# Patient Record
Sex: Female | Born: 1950 | Race: Black or African American | Hispanic: No | Marital: Single | Smoking: Current some day smoker
Health system: Southern US, Community
[De-identification: ages and names within clinical notes are randomized; demographics above are authoritative.]

## PROBLEM LIST (undated history)

## (undated) DIAGNOSIS — K219 Gastro-esophageal reflux disease without esophagitis: Secondary | ICD-10-CM

## (undated) DIAGNOSIS — A159 Respiratory tuberculosis unspecified: Secondary | ICD-10-CM

## (undated) DIAGNOSIS — M1990 Unspecified osteoarthritis, unspecified site: Secondary | ICD-10-CM

## (undated) DIAGNOSIS — R0602 Shortness of breath: Secondary | ICD-10-CM

## (undated) DIAGNOSIS — N183 Chronic kidney disease, stage 3 unspecified: Secondary | ICD-10-CM

## (undated) DIAGNOSIS — R0601 Orthopnea: Secondary | ICD-10-CM

## (undated) DIAGNOSIS — J449 Chronic obstructive pulmonary disease, unspecified: Secondary | ICD-10-CM

## (undated) DIAGNOSIS — I10 Essential (primary) hypertension: Secondary | ICD-10-CM

## (undated) DIAGNOSIS — E7800 Pure hypercholesterolemia, unspecified: Secondary | ICD-10-CM

## (undated) DIAGNOSIS — J45909 Unspecified asthma, uncomplicated: Secondary | ICD-10-CM

## (undated) HISTORY — PX: COLONOSCOPY: SHX174

## (undated) HISTORY — PX: TONSILLECTOMY: SUR1361

## (undated) HISTORY — PX: TUBAL LIGATION: SHX77

---

## 1994-08-09 DIAGNOSIS — A159 Respiratory tuberculosis unspecified: Secondary | ICD-10-CM

## 1994-08-09 HISTORY — DX: Respiratory tuberculosis unspecified: A15.9

## 2013-05-30 ENCOUNTER — Other Ambulatory Visit: Payer: Self-pay | Admitting: Orthopedic Surgery

## 2013-06-06 ENCOUNTER — Encounter (HOSPITAL_COMMUNITY): Payer: Self-pay | Admitting: Pharmacy Technician

## 2013-06-06 ENCOUNTER — Encounter (HOSPITAL_COMMUNITY)
Admission: RE | Admit: 2013-06-06 | Discharge: 2013-06-06 | Disposition: A | Discharge status: Home / Self Care | Payer: Medicare Other | Source: Ambulatory Visit | Attending: Orthopedic Surgery | Admitting: Orthopedic Surgery

## 2013-06-06 ENCOUNTER — Encounter (HOSPITAL_COMMUNITY): Payer: Self-pay

## 2013-06-06 DIAGNOSIS — Z01812 Encounter for preprocedural laboratory examination: Secondary | ICD-10-CM | POA: Insufficient documentation

## 2013-06-06 DIAGNOSIS — Z01810 Encounter for preprocedural cardiovascular examination: Secondary | ICD-10-CM | POA: Insufficient documentation

## 2013-06-06 DIAGNOSIS — Z01818 Encounter for other preprocedural examination: Secondary | ICD-10-CM | POA: Insufficient documentation

## 2013-06-06 HISTORY — DX: Chronic obstructive pulmonary disease, unspecified: J44.9

## 2013-06-06 HISTORY — DX: Essential (primary) hypertension: I10

## 2013-06-06 HISTORY — DX: Gastro-esophageal reflux disease without esophagitis: K21.9

## 2013-06-06 HISTORY — DX: Respiratory tuberculosis unspecified: A15.9

## 2013-06-06 HISTORY — DX: Unspecified asthma, uncomplicated: J45.909

## 2013-06-06 LAB — APTT: aPTT: 29 seconds (ref 24–37)

## 2013-06-06 LAB — URINALYSIS, ROUTINE W REFLEX MICROSCOPIC
Bilirubin Urine: NEGATIVE
Ketones, ur: NEGATIVE mg/dL
Nitrite: POSITIVE — AB
Urobilinogen, UA: 1 mg/dL (ref 0.0–1.0)

## 2013-06-06 LAB — CBC WITH DIFFERENTIAL/PLATELET
Eosinophils Relative: 4 % (ref 0–5)
Hemoglobin: 13.9 g/dL (ref 12.0–15.0)
Lymphocytes Relative: 43 % (ref 12–46)
Lymphs Abs: 4.1 10*3/uL — ABNORMAL HIGH (ref 0.7–4.0)
MCH: 30.6 pg (ref 26.0–34.0)
MCV: 89 fL (ref 78.0–100.0)
Monocytes Relative: 7 % (ref 3–12)
Neutrophils Relative %: 46 % (ref 43–77)
Platelets: 217 10*3/uL (ref 150–400)
RBC: 4.54 MIL/uL (ref 3.87–5.11)
WBC: 9.5 10*3/uL (ref 4.0–10.5)

## 2013-06-06 LAB — BASIC METABOLIC PANEL
BUN: 26 mg/dL — ABNORMAL HIGH (ref 6–23)
CO2: 25 mEq/L (ref 19–32)
Calcium: 9.5 mg/dL (ref 8.4–10.5)
Chloride: 100 mEq/L (ref 96–112)
GFR calc non Af Amer: 41 mL/min — ABNORMAL LOW (ref >90)
Glucose, Bld: 92 mg/dL (ref 70–99)
Potassium: 4.5 mEq/L (ref 3.5–5.1)
Sodium: 138 mEq/L (ref 135–145)

## 2013-06-06 LAB — PROTIME-INR
INR: 0.96 (ref 0.00–1.49)
Prothrombin Time: 12.6 seconds (ref 11.6–15.2)

## 2013-06-06 LAB — SURGICAL PCR SCREEN: MRSA, PCR: NEGATIVE

## 2013-06-06 LAB — TYPE AND SCREEN
ABO/RH(D): A POS
Antibody Screen: NEGATIVE

## 2013-06-06 NOTE — Pre-Procedure Instructions (Signed)
LUCINDY BOREL  06/06/2013   Your procedure is scheduled on:  June 13, 2013  Report to [facility] Short Stay (use Main Entrance "A'') at 10:45 AM.  Call this number if you have problems the morning of surgery: [phone]   Remember:   Do not eat food or drink liquids after midnight.   Take these medicines the morning of surgery with A SIP OF WATER: Atenolol 25 mg, Amlodipine 10 mg,Omeprazole 40 mg, Symbicort 1 puff if needed: Hydrocodone for pain Stop taking Aspirin, vitamins, and herbal medications. Do not take any NSAIDs ie: Ibuprofen, Advil, Naproxen or any medication containing Aspirin.  Do not wear jewelry, make-up or nail polish.  Do not wear lotions, powders, or perfumes. You may wear deodorant.  Do not shave 48 hours prior to surgery..  Do not bring valuables to the hospital.  [facility] is not responsible for any belongings or valuables.               Contacts, dentures or bridgework may not be worn into surgery.  Leave suitcase in the car. After surgery it may be brought to your room.  For patients admitted to the hospital, discharge time is determined by your treatment team.               Patients discharged the day of surgery will not be allowed to drive home.  Name and phone number of your driver:   Special Instructions: Shower using CHG 2 nights before surgery and the night before surgery.  If you shower the day of surgery use CHG.  Use special wash - you have one bottle of CHG for all showers.  You should use approximately 1/3 of the bottle for each shower.   Please read over the following fact sheets that you were given: Pain Booklet, Coughing and Deep Breathing, Blood Transfusion Information, MRSA Information and Surgical Site Infection Prevention

## 2013-06-06 NOTE — Progress Notes (Signed)
Pt stated that she gets SOB with exertion but denies chest pain and being under the care of a cardiologist. Pt denies having an EKG and chest x ray within the lat year. Pt stated that she had an stress test 7 years ago but unsure of the location and an echo around 2 years ago at [facility] records were requested from Dr. Georgena Spurling.

## 2013-06-07 ENCOUNTER — Encounter (HOSPITAL_COMMUNITY): Payer: Self-pay

## 2013-06-07 NOTE — Progress Notes (Signed)
Holly in medical records at [facility] is checking to see if pt. Has echo/stress/ekg/notes from a Dr. Irma Newness [phone]. She states his office is closed but she will go over there later today.

## 2013-06-07 NOTE — Progress Notes (Addendum)
Anesthesia Chart Review:  Patient is a 62 year old female scheduled for left TKA on 06/13/13 by Dr. Turner Daniels.  History includes smoking, HTN, CKD, asthma, COPD, chronic DOE, GERD, TB '96, tonsillectomy, tubal ligation. She reported marijuana use. PCP is Dr. Patria Mane with [facility]) who is aware of plans for surgery (see 05/04/13 note).  His note mentions that she will ultimately require a right TKA as well.  EKG on 06/06/13 showed SB @ 53 bpm. She reported a stress test > 5 years ago.  She thought that she had an echo within the past three years.  Records from Dr. Retta Mac at [facility] [phone]) have been requested, but are still pending.  CXR on 06/06/13 showed no acute finding.  Preoperative labs noted. UA was positive for nitrites, trace leukocytes, and many bacteria.  No culture was done, and now too much time has passed to add a C&S.  I have called UA results to Agustin Cree at Dr. Wadie Lessen office.  She will have him review for further recommendations.  I'll follow-up once additional cardiology records if received.  Velna Ochs [facility] Short Stay Center/Anesthesiology Phone [phone] 06/07/2013 4:21 PM  Addendum: 06/08/2013 5:00 PM I received last 2012 records from Dr. Luberta Robertson.  She was seen for chest and abdominal pain with nocturnal reflux symptoms.  From notes, it appears GERD was the likely culprit as a PPI was prescribed, and she was referred to Dr. Norma Fredrickson with GI.  Any cardiac studies on file requested as available--none received.  [facility] also does not have any records of echo being done there.  I did leave voice mail with patient earlier today asking her to call me so I could inquire where to locate a copy of her reported prior echo--if it has been within the past three years.  She reported her stress test was > 5 years ago.    Her preoperative EKG was unremarkable and her PCP is aware of plans for surgery. He last saw her  on 05/04/13 and no additional testing was ordered preoperatively.  I will follow-up any additional records if I hear back from patient, otherwise she will be evaluated by her assigned anesthesiologist on the day of surgery.  If no acute cardiopulmonary symptoms then I would anticipate that she could proceed as planned.

## 2013-06-12 DIAGNOSIS — M1712 Unilateral primary osteoarthritis, left knee: Secondary | ICD-10-CM | POA: Diagnosis present

## 2013-06-12 MED ORDER — CEFAZOLIN SODIUM-DEXTROSE 2-3 GM-% IV SOLR
2.0000 g | INTRAVENOUS | Status: AC
  Administered 2013-06-13: 2 g via INTRAVENOUS
  Filled 2013-06-12: qty 50

## 2013-06-12 NOTE — H&P (Signed)
TOTAL KNEE ADMISSION H&P  Patient is being admitted for left total knee arthroplasty.  Subjective:  Chief Complaint:left knee pain.  HPI: Terri York, 62 y.o. female, has a history of pain and functional disability in the left knee due to arthritis and has failed non-surgical conservative treatments for greater than 12 weeks to includeNSAID's and/or analgesics, corticosteriod injections and activity modification.  Onset of symptoms was gradual, starting 6 years ago with gradually worsening course since that time. The patient noted no past surgery on the left knee(s).  Patient currently rates pain in the left knee(s) at 10 out of 10 with activity. Patient has night pain, worsening of pain with activity and weight bearing, pain that interferes with activities of daily living and pain with passive range of motion.  Patient has evidence of joint space narrowing by imaging studies.  There is no active infection.  There are no active problems to display for this patient.  Past Medical History  Diagnosis Date  . Hypertension   . Asthma   . COPD (chronic obstructive pulmonary disease)   . Shortness of breath     Hx: of with exertion  . GERD (gastroesophageal reflux disease)   . Arthritis   . Tuberculosis     Hx: of 1996  . CKD (chronic kidney disease)     stage III (according to 04/2013 Dr. Georgena Spurling notes)    Past Surgical History  Procedure Laterality Date  . Tubal ligation    . Tonsillectomy    . Colonoscopy      Hx: of    No prescriptions prior to admission   No Known Allergies  History  Substance Use Topics  . Smoking status: Current Some Day Smoker    Types: Cigarettes  . Smokeless tobacco: Never Used  . Alcohol Use: No    Family History  Problem Relation Age of Onset  . Hypertension Mother   . Arthritis/Rheumatoid Mother   . Diabetes Father   . Heart failure Father   . Arthritis/Rheumatoid Brother   . Diabetes Other      Review of Systems  Constitutional:  Negative.   HENT: Negative.   Eyes: Negative.   Respiratory: Negative.   Cardiovascular: Negative.   Gastrointestinal: Negative.   Genitourinary: Negative.   Musculoskeletal: Negative.   Skin: Negative.   Neurological: Negative.   Endo/Heme/Allergies: Negative.   Psychiatric/Behavioral: Negative.     Objective:  Physical Exam  Constitutional: She is oriented to person, place, and time. She appears well-developed and well-nourished.  HENT:  Head: Normocephalic and atraumatic.  Eyes: Pupils are equal, round, and reactive to light.  Neck: Normal range of motion.  Cardiovascular: Intact distal pulses.   Respiratory: Effort normal.  Musculoskeletal: She exhibits tenderness.  Patient is 5 foot 5 inches and weighs 179 pounds.  Patient has good strength bilaterally in her knees.  The patient's right knee has a range from 5 to 120.  Patient's left knee has a range from 5 to 100.  Patient has obvious crepitus with range of motion.  She does have pain with palpation over the lateral joint lines bilaterally.  No noticeable instability.  Her calves are soft and nontender.  She is neurovascularly intact distally.    Neurological: She is alert and oriented to person, place, and time. She has normal reflexes.  Skin: Skin is warm and dry.  Psychiatric: She has a normal mood and affect. Her behavior is normal. Judgment and thought content normal.    Vital signs in  last 24 hours:    Labs:   There is no height or weight on file to calculate BMI.   Imaging Review X-rays were ordered, performed, and interpreted by myself and Dr. Turner Daniels today included; bilateral AP weightbearing, bilateral Zoila Shutter, lateral and sunrise views of bilateral knees are taken and reviewed in office today.  Patient does have severe bone-on-bone arthritis over the lateral compartment bilaterally.  Patient also has mild patellofemoral arthritis.  Assessment/Plan:  End stage arthritis, left knee   The patient  history, physical examination, clinical judgment of the provider and imaging studies are consistent with end stage degenerative joint disease of the left knee(s) and total knee arthroplasty is deemed medically necessary. The treatment options including medical management, injection therapy arthroscopy and arthroplasty were discussed at length. The risks and benefits of total knee arthroplasty were presented and reviewed. The risks due to aseptic loosening, infection, stiffness, patella tracking problems, thromboembolic complications and other imponderables were discussed. The patient acknowledged the explanation, agreed to proceed with the plan and consent was signed. Patient is being admitted for inpatient treatment for surgery, pain control, PT, OT, prophylactic antibiotics, VTE prophylaxis, progressive ambulation and ADL's and discharge planning. The patient is planning to be discharged to skilled nursing facility

## 2013-06-13 ENCOUNTER — Encounter (HOSPITAL_COMMUNITY)
Admission: RE | Disposition: A | Discharge status: Home Health | Payer: Self-pay | Source: Ambulatory Visit | Attending: Orthopedic Surgery

## 2013-06-13 ENCOUNTER — Encounter (HOSPITAL_COMMUNITY): Payer: Medicare Other | Admitting: Vascular Surgery

## 2013-06-13 ENCOUNTER — Inpatient Hospital Stay (HOSPITAL_COMMUNITY)
Admission: RE | Admit: 2013-06-13 | Discharge: 2013-06-16 | DRG: 470 | Disposition: A | Discharge status: Home Health | Payer: Medicare Other | Source: Ambulatory Visit | Attending: Orthopedic Surgery | Admitting: Orthopedic Surgery

## 2013-06-13 ENCOUNTER — Encounter (HOSPITAL_COMMUNITY): Payer: Self-pay | Admitting: *Deleted

## 2013-06-13 ENCOUNTER — Inpatient Hospital Stay (HOSPITAL_COMMUNITY): Payer: Medicare Other | Admitting: Anesthesiology

## 2013-06-13 DIAGNOSIS — Z833 Family history of diabetes mellitus: Secondary | ICD-10-CM

## 2013-06-13 DIAGNOSIS — F17200 Nicotine dependence, unspecified, uncomplicated: Secondary | ICD-10-CM | POA: Diagnosis present

## 2013-06-13 DIAGNOSIS — Z9851 Tubal ligation status: Secondary | ICD-10-CM

## 2013-06-13 DIAGNOSIS — Z7982 Long term (current) use of aspirin: Secondary | ICD-10-CM

## 2013-06-13 DIAGNOSIS — F1210 Cannabis abuse, uncomplicated: Secondary | ICD-10-CM | POA: Diagnosis present

## 2013-06-13 DIAGNOSIS — M1710 Unilateral primary osteoarthritis, unspecified knee: Principal | ICD-10-CM | POA: Diagnosis present

## 2013-06-13 DIAGNOSIS — Z79899 Other long term (current) drug therapy: Secondary | ICD-10-CM

## 2013-06-13 DIAGNOSIS — I129 Hypertensive chronic kidney disease with stage 1 through stage 4 chronic kidney disease, or unspecified chronic kidney disease: Secondary | ICD-10-CM | POA: Diagnosis present

## 2013-06-13 DIAGNOSIS — K219 Gastro-esophageal reflux disease without esophagitis: Secondary | ICD-10-CM | POA: Diagnosis present

## 2013-06-13 DIAGNOSIS — E7800 Pure hypercholesterolemia, unspecified: Secondary | ICD-10-CM | POA: Diagnosis present

## 2013-06-13 DIAGNOSIS — J4489 Other specified chronic obstructive pulmonary disease: Secondary | ICD-10-CM | POA: Diagnosis present

## 2013-06-13 DIAGNOSIS — Z8261 Family history of arthritis: Secondary | ICD-10-CM

## 2013-06-13 DIAGNOSIS — N183 Chronic kidney disease, stage 3 unspecified: Secondary | ICD-10-CM | POA: Diagnosis present

## 2013-06-13 DIAGNOSIS — Z8249 Family history of ischemic heart disease and other diseases of the circulatory system: Secondary | ICD-10-CM

## 2013-06-13 DIAGNOSIS — Z23 Encounter for immunization: Secondary | ICD-10-CM

## 2013-06-13 DIAGNOSIS — Z8611 Personal history of tuberculosis: Secondary | ICD-10-CM

## 2013-06-13 DIAGNOSIS — M1712 Unilateral primary osteoarthritis, left knee: Secondary | ICD-10-CM | POA: Diagnosis present

## 2013-06-13 DIAGNOSIS — J449 Chronic obstructive pulmonary disease, unspecified: Secondary | ICD-10-CM | POA: Diagnosis present

## 2013-06-13 HISTORY — DX: Pure hypercholesterolemia, unspecified: E78.00

## 2013-06-13 HISTORY — DX: Chronic kidney disease, stage 3 (moderate): N18.3

## 2013-06-13 HISTORY — DX: Orthopnea: R06.01

## 2013-06-13 HISTORY — DX: Chronic kidney disease, stage 3 unspecified: N18.30

## 2013-06-13 HISTORY — PX: TOTAL KNEE ARTHROPLASTY: SHX125

## 2013-06-13 HISTORY — DX: Shortness of breath: R06.02

## 2013-06-13 HISTORY — DX: Unspecified osteoarthritis, unspecified site: M19.90

## 2013-06-13 SURGERY — ARTHROPLASTY, KNEE, TOTAL
Anesthesia: General | Site: Knee | Laterality: Left | Wound class: Clean

## 2013-06-13 MED ORDER — OXYCODONE HCL 5 MG PO TABS
5.0000 mg | ORAL_TABLET | ORAL | Status: DC | PRN
  Administered 2013-06-13 – 2013-06-16 (×18): 10 mg via ORAL
  Filled 2013-06-13 (×18): qty 2

## 2013-06-13 MED ORDER — CHLORHEXIDINE GLUCONATE 4 % EX LIQD: 60.0000 mL | Freq: Once | CUTANEOUS | Status: DC

## 2013-06-13 MED ORDER — HYDROMORPHONE HCL PF 1 MG/ML IJ SOLN
0.5000 mg | Freq: Once | INTRAMUSCULAR | Status: AC
  Administered 2013-06-13: 0.5 mg via INTRAVENOUS

## 2013-06-13 MED ORDER — BUPIVACAINE LIPOSOME 1.3 % IJ SUSP
20.0000 mL | Freq: Once | INTRAMUSCULAR | Status: DC
  Filled 2013-06-13: qty 20

## 2013-06-13 MED ORDER — PROPOFOL 10 MG/ML IV BOLUS
INTRAVENOUS | Status: DC | PRN
  Administered 2013-06-13: 100 mg via INTRAVENOUS
  Administered 2013-06-13: 200 mg via INTRAVENOUS

## 2013-06-13 MED ORDER — 0.9 % SODIUM CHLORIDE (POUR BTL) OPTIME
TOPICAL | Status: DC | PRN
  Administered 2013-06-13: 1000 mL

## 2013-06-13 MED ORDER — BUDESONIDE-FORMOTEROL FUMARATE 160-4.5 MCG/ACT IN AERO
2.0000 | INHALATION_SPRAY | Freq: Two times a day (BID) | RESPIRATORY_TRACT | Status: DC
  Administered 2013-06-14 – 2013-06-16 (×4): 2 via RESPIRATORY_TRACT
  Filled 2013-06-13: qty 6

## 2013-06-13 MED ORDER — ONDANSETRON HCL 4 MG/2ML IJ SOLN
INTRAMUSCULAR | Status: DC | PRN
  Administered 2013-06-13: 4 mg via INTRAVENOUS

## 2013-06-13 MED ORDER — MIDAZOLAM HCL 2 MG/2ML IJ SOLN
INTRAMUSCULAR | Status: AC
  Filled 2013-06-13: qty 2

## 2013-06-13 MED ORDER — MEPERIDINE HCL 25 MG/ML IJ SOLN: 6.2500 mg | INTRAMUSCULAR | Status: DC | PRN

## 2013-06-13 MED ORDER — HYDROMORPHONE HCL PF 1 MG/ML IJ SOLN
INTRAMUSCULAR | Status: AC
  Filled 2013-06-13: qty 1

## 2013-06-13 MED ORDER — MIDAZOLAM HCL 2 MG/2ML IJ SOLN
0.5000 mg | Freq: Once | INTRAMUSCULAR | Status: AC
  Administered 2013-06-13: 0.5 mg via INTRAVENOUS

## 2013-06-13 MED ORDER — LISINOPRIL 40 MG PO TABS
40.0000 mg | ORAL_TABLET | Freq: Every day | ORAL | Status: DC
  Administered 2013-06-13 – 2013-06-16 (×4): 40 mg via ORAL
  Filled 2013-06-13 (×4): qty 1

## 2013-06-13 MED ORDER — LACTATED RINGERS IV SOLN
INTRAVENOUS | Status: DC | PRN
  Administered 2013-06-13 (×2): via INTRAVENOUS

## 2013-06-13 MED ORDER — METHOCARBAMOL 100 MG/ML IJ SOLN
500.0000 mg | Freq: Four times a day (QID) | INTRAVENOUS | Status: DC | PRN
  Administered 2013-06-13: 500 mg via INTRAVENOUS
  Filled 2013-06-13: qty 5

## 2013-06-13 MED ORDER — TRANEXAMIC ACID 100 MG/ML IV SOLN
1000.0000 mg | INTRAVENOUS | Status: AC
  Administered 2013-06-13: 1000 mg via INTRAVENOUS
  Filled 2013-06-13: qty 10

## 2013-06-13 MED ORDER — CEFUROXIME SODIUM 1.5 G IJ SOLR
INTRAMUSCULAR | Status: DC | PRN
  Administered 2013-06-13: 1.5 g

## 2013-06-13 MED ORDER — SUCCINYLCHOLINE CHLORIDE 20 MG/ML IJ SOLN
INTRAMUSCULAR | Status: DC | PRN
  Administered 2013-06-13: 100 mg via INTRAVENOUS

## 2013-06-13 MED ORDER — METOCLOPRAMIDE HCL 10 MG PO TABS: 5.0000 mg | ORAL_TABLET | Freq: Three times a day (TID) | ORAL | Status: DC | PRN

## 2013-06-13 MED ORDER — SENNOSIDES-DOCUSATE SODIUM 8.6-50 MG PO TABS: 1.0000 | ORAL_TABLET | Freq: Every evening | ORAL | Status: DC | PRN

## 2013-06-13 MED ORDER — CEFUROXIME SODIUM 1.5 G IJ SOLR
INTRAMUSCULAR | Status: AC
  Filled 2013-06-13: qty 1.5

## 2013-06-13 MED ORDER — DOCUSATE SODIUM 100 MG PO CAPS
100.0000 mg | ORAL_CAPSULE | Freq: Two times a day (BID) | ORAL | Status: DC
  Administered 2013-06-13 – 2013-06-16 (×6): 100 mg via ORAL
  Filled 2013-06-13 (×7): qty 1

## 2013-06-13 MED ORDER — MENTHOL 3 MG MT LOZG: 1.0000 | LOZENGE | OROMUCOSAL | Status: DC | PRN

## 2013-06-13 MED ORDER — PANTOPRAZOLE SODIUM 40 MG PO TBEC
80.0000 mg | DELAYED_RELEASE_TABLET | Freq: Every day | ORAL | Status: DC
  Administered 2013-06-14 – 2013-06-16 (×4): 80 mg via ORAL
  Filled 2013-06-13 (×4): qty 2

## 2013-06-13 MED ORDER — HYDROMORPHONE HCL PF 1 MG/ML IJ SOLN
INTRAMUSCULAR | Status: DC | PRN
  Administered 2013-06-13 (×4): .25 mg via INTRAVENOUS

## 2013-06-13 MED ORDER — ASPIRIN EC 325 MG PO TBEC: 325.0000 mg | DELAYED_RELEASE_TABLET | Freq: Two times a day (BID) | ORAL | Status: AC

## 2013-06-13 MED ORDER — OXYCODONE HCL 5 MG PO TABS
ORAL_TABLET | ORAL | Status: AC
  Filled 2013-06-13: qty 1

## 2013-06-13 MED ORDER — ONDANSETRON HCL 4 MG PO TABS: 4.0000 mg | ORAL_TABLET | Freq: Four times a day (QID) | ORAL | Status: DC | PRN

## 2013-06-13 MED ORDER — METHOCARBAMOL 500 MG PO TABS: 500.0000 mg | ORAL_TABLET | Freq: Two times a day (BID) | ORAL | Status: AC

## 2013-06-13 MED ORDER — AMLODIPINE BESYLATE 10 MG PO TABS
10.0000 mg | ORAL_TABLET | Freq: Every day | ORAL | Status: DC
  Administered 2013-06-13 – 2013-06-16 (×4): 10 mg via ORAL
  Filled 2013-06-13 (×4): qty 1

## 2013-06-13 MED ORDER — BUPIVACAINE-EPINEPHRINE PF 0.5-1:200000 % IJ SOLN
INTRAMUSCULAR | Status: DC | PRN
  Administered 2013-06-13: 30 mL

## 2013-06-13 MED ORDER — OXYCODONE-ACETAMINOPHEN 5-325 MG PO TABS: 1.0000 | ORAL_TABLET | ORAL | Status: AC | PRN

## 2013-06-13 MED ORDER — SODIUM CHLORIDE 0.9 % IR SOLN
Status: DC | PRN
  Administered 2013-06-13: 3000 mL

## 2013-06-13 MED ORDER — OXYCODONE HCL 5 MG/5ML PO SOLN: 5.0000 mg | Freq: Once | ORAL | Status: AC | PRN

## 2013-06-13 MED ORDER — MIDAZOLAM HCL 5 MG/5ML IJ SOLN
INTRAMUSCULAR | Status: DC | PRN
  Administered 2013-06-13 (×2): 2 mg via INTRAVENOUS

## 2013-06-13 MED ORDER — SODIUM CHLORIDE 0.9 % IJ SOLN
INTRAMUSCULAR | Status: DC | PRN
  Administered 2013-06-13: 14:00:00

## 2013-06-13 MED ORDER — ONDANSETRON HCL 4 MG/2ML IJ SOLN
4.0000 mg | Freq: Four times a day (QID) | INTRAMUSCULAR | Status: DC | PRN
  Administered 2013-06-14: 4 mg via INTRAVENOUS
  Filled 2013-06-13: qty 2

## 2013-06-13 MED ORDER — ATENOLOL 25 MG PO TABS
25.0000 mg | ORAL_TABLET | Freq: Every day | ORAL | Status: DC
  Administered 2013-06-13 – 2013-06-16 (×4): 25 mg via ORAL
  Filled 2013-06-13 (×4): qty 1

## 2013-06-13 MED ORDER — FENTANYL CITRATE 0.05 MG/ML IJ SOLN
INTRAMUSCULAR | Status: DC | PRN
  Administered 2013-06-13 (×2): 50 ug via INTRAVENOUS
  Administered 2013-06-13: 150 ug via INTRAVENOUS

## 2013-06-13 MED ORDER — DEXTROSE-NACL 5-0.45 % IV SOLN: INTRAVENOUS | Status: DC

## 2013-06-13 MED ORDER — ONDANSETRON HCL 4 MG/2ML IJ SOLN: 4.0000 mg | Freq: Once | INTRAMUSCULAR | Status: DC | PRN

## 2013-06-13 MED ORDER — ALUM & MAG HYDROXIDE-SIMETH 200-200-20 MG/5ML PO SUSP
30.0000 mL | ORAL | Status: DC | PRN
  Administered 2013-06-16: 30 mL via ORAL
  Filled 2013-06-13 (×2): qty 30

## 2013-06-13 MED ORDER — BISACODYL 5 MG PO TBEC: 5.0000 mg | DELAYED_RELEASE_TABLET | Freq: Every day | ORAL | Status: DC | PRN

## 2013-06-13 MED ORDER — OXYCODONE HCL 5 MG PO TABS
5.0000 mg | ORAL_TABLET | Freq: Once | ORAL | Status: AC | PRN
  Administered 2013-06-13: 5 mg via ORAL

## 2013-06-13 MED ORDER — DIPHENHYDRAMINE HCL 12.5 MG/5ML PO ELIX: 12.5000 mg | ORAL_SOLUTION | ORAL | Status: DC | PRN

## 2013-06-13 MED ORDER — ACETAMINOPHEN 650 MG RE SUPP: 650.0000 mg | Freq: Four times a day (QID) | RECTAL | Status: DC | PRN

## 2013-06-13 MED ORDER — ACETAMINOPHEN 325 MG PO TABS
650.0000 mg | ORAL_TABLET | Freq: Four times a day (QID) | ORAL | Status: DC | PRN
  Administered 2013-06-13: 650 mg via ORAL
  Filled 2013-06-13: qty 2

## 2013-06-13 MED ORDER — TRANEXAMIC ACID 100 MG/ML IV SOLN: 1000.0000 mg | INTRAVENOUS | Status: DC | PRN

## 2013-06-13 MED ORDER — KCL IN DEXTROSE-NACL 20-5-0.45 MEQ/L-%-% IV SOLN
INTRAVENOUS | Status: DC
  Administered 2013-06-13 – 2013-06-14 (×3): via INTRAVENOUS
  Filled 2013-06-13 (×11): qty 1000

## 2013-06-13 MED ORDER — ASPIRIN EC 325 MG PO TBEC
325.0000 mg | DELAYED_RELEASE_TABLET | Freq: Every day | ORAL | Status: DC
  Administered 2013-06-14 – 2013-06-16 (×3): 325 mg via ORAL
  Filled 2013-06-13 (×4): qty 1

## 2013-06-13 MED ORDER — LIDOCAINE HCL (CARDIAC) 20 MG/ML IV SOLN
INTRAVENOUS | Status: DC | PRN
  Administered 2013-06-13: 100 mg via INTRAVENOUS

## 2013-06-13 MED ORDER — METHOCARBAMOL 500 MG PO TABS
500.0000 mg | ORAL_TABLET | Freq: Four times a day (QID) | ORAL | Status: DC | PRN
  Administered 2013-06-13 – 2013-06-16 (×8): 500 mg via ORAL
  Filled 2013-06-13 (×11): qty 1

## 2013-06-13 MED ORDER — PHENOL 1.4 % MT LIQD: 1.0000 | OROMUCOSAL | Status: DC | PRN

## 2013-06-13 MED ORDER — METOCLOPRAMIDE HCL 5 MG/ML IJ SOLN: 5.0000 mg | Freq: Three times a day (TID) | INTRAMUSCULAR | Status: DC | PRN

## 2013-06-13 MED ORDER — HYDROMORPHONE HCL PF 1 MG/ML IJ SOLN
0.2500 mg | INTRAMUSCULAR | Status: DC | PRN
  Administered 2013-06-13 (×4): 0.5 mg via INTRAVENOUS

## 2013-06-13 MED ORDER — LACTATED RINGERS IV SOLN
INTRAVENOUS | Status: DC
  Administered 2013-06-13: 11:00:00 via INTRAVENOUS

## 2013-06-13 MED ORDER — HYDROMORPHONE HCL PF 1 MG/ML IJ SOLN
0.5000 mg | INTRAMUSCULAR | Status: DC | PRN
  Administered 2013-06-13 – 2013-06-14 (×6): 0.5 mg via INTRAVENOUS
  Filled 2013-06-13 (×6): qty 1

## 2013-06-13 MED ORDER — MAGNESIUM CITRATE PO SOLN: 1.0000 | Freq: Once | ORAL | Status: AC | PRN

## 2013-06-13 MED ORDER — ARTIFICIAL TEARS OP OINT
TOPICAL_OINTMENT | OPHTHALMIC | Status: DC | PRN
  Administered 2013-06-13: 1 via OPHTHALMIC

## 2013-06-13 SURGICAL SUPPLY — 57 items
BANDAGE ELASTIC 6 VELCRO ST LF (GAUZE/BANDAGES/DRESSINGS) ×2 IMPLANT
BANDAGE ESMARK 6X9 LF (GAUZE/BANDAGES/DRESSINGS) ×1 IMPLANT
BLADE SAG 18X100X1.27 (BLADE) ×2 IMPLANT
BLADE SAW SGTL 13X75X1.27 (BLADE) ×2 IMPLANT
BLADE SURG ROTATE 9660 (MISCELLANEOUS) IMPLANT
BNDG ELASTIC 6X10 VLCR STRL LF (GAUZE/BANDAGES/DRESSINGS) ×2 IMPLANT
BNDG ESMARK 6X9 LF (GAUZE/BANDAGES/DRESSINGS) ×2
BOWL SMART MIX CTS (DISPOSABLE) ×2 IMPLANT
CAPT RP KNEE ×2 IMPLANT
CEMENT HV SMART SET (Cement) ×4 IMPLANT
CLOTH BEACON ORANGE TIMEOUT ST (SAFETY) ×2 IMPLANT
COVER SURGICAL LIGHT HANDLE (MISCELLANEOUS) ×2 IMPLANT
CUFF TOURNIQUET SINGLE 34IN LL (TOURNIQUET CUFF) ×2 IMPLANT
CUFF TOURNIQUET SINGLE 44IN (TOURNIQUET CUFF) IMPLANT
DRAPE EXTREMITY T 121X128X90 (DRAPE) ×2 IMPLANT
DRAPE U-SHAPE 47X51 STRL (DRAPES) ×2 IMPLANT
DURAPREP 26ML APPLICATOR (WOUND CARE) ×4 IMPLANT
ELECT REM PT RETURN 9FT ADLT (ELECTROSURGICAL) ×2
ELECTRODE REM PT RTRN 9FT ADLT (ELECTROSURGICAL) ×1 IMPLANT
EVACUATOR 1/8 PVC DRAIN (DRAIN) ×2 IMPLANT
GAUZE XEROFORM 1X8 LF (GAUZE/BANDAGES/DRESSINGS) ×2 IMPLANT
GLOVE BIO SURGEON STRL SZ7.5 (GLOVE) ×2 IMPLANT
GLOVE BIO SURGEON STRL SZ8.5 (GLOVE) ×4 IMPLANT
GLOVE BIOGEL PI IND STRL 8 (GLOVE) ×2 IMPLANT
GLOVE BIOGEL PI IND STRL 9 (GLOVE) ×1 IMPLANT
GLOVE BIOGEL PI INDICATOR 8 (GLOVE) ×2
GLOVE BIOGEL PI INDICATOR 9 (GLOVE) ×1
GOWN PREVENTION PLUS XLARGE (GOWN DISPOSABLE) ×2 IMPLANT
GOWN STRL NON-REIN LRG LVL3 (GOWN DISPOSABLE) ×2 IMPLANT
GOWN STRL REIN XL XLG (GOWN DISPOSABLE) ×4 IMPLANT
HANDPIECE INTERPULSE COAX TIP (DISPOSABLE) ×1
HOOD PEEL AWAY FACE SHEILD DIS (HOOD) ×4 IMPLANT
KIT BASIN OR (CUSTOM PROCEDURE TRAY) ×2 IMPLANT
KIT ROOM TURNOVER OR (KITS) ×2 IMPLANT
MANIFOLD NEPTUNE II (INSTRUMENTS) ×2 IMPLANT
NDL SAFETY ECLIPSE 18X1.5 (NEEDLE) ×1 IMPLANT
NEEDLE HYPO 18GX1.5 SHARP (NEEDLE) ×1
NEEDLE SPNL 18GX3.5 QUINCKE PK (NEEDLE) ×2 IMPLANT
NS IRRIG 1000ML POUR BTL (IV SOLUTION) ×2 IMPLANT
PACK TOTAL JOINT (CUSTOM PROCEDURE TRAY) ×2 IMPLANT
PAD ARMBOARD 7.5X6 YLW CONV (MISCELLANEOUS) ×4 IMPLANT
PADDING CAST COTTON 6X4 STRL (CAST SUPPLIES) ×2 IMPLANT
SET HNDPC FAN SPRY TIP SCT (DISPOSABLE) ×1 IMPLANT
SPONGE GAUZE 4X4 12PLY (GAUZE/BANDAGES/DRESSINGS) ×2 IMPLANT
STAPLER VISISTAT 35W (STAPLE) ×2 IMPLANT
SUCTION FRAZIER TIP 10 FR DISP (SUCTIONS) ×2 IMPLANT
SUT VIC AB 0 CTX 36 (SUTURE) ×1
SUT VIC AB 0 CTX36XBRD ANTBCTR (SUTURE) ×1 IMPLANT
SUT VIC AB 1 CTX 36 (SUTURE) ×1
SUT VIC AB 1 CTX36XBRD ANBCTR (SUTURE) ×1 IMPLANT
SUT VIC AB 2-0 CT1 27 (SUTURE) ×1
SUT VIC AB 2-0 CT1 TAPERPNT 27 (SUTURE) ×1 IMPLANT
SYR 50ML LL SCALE MARK (SYRINGE) ×2 IMPLANT
TOWEL OR 17X24 6PK STRL BLUE (TOWEL DISPOSABLE) ×2 IMPLANT
TOWEL OR 17X26 10 PK STRL BLUE (TOWEL DISPOSABLE) ×2 IMPLANT
TRAY FOLEY CATH 14FR (SET/KITS/TRAYS/PACK) ×2 IMPLANT
WATER STERILE IRR 1000ML POUR (IV SOLUTION) ×4 IMPLANT

## 2013-06-13 NOTE — Anesthesia Procedure Notes (Addendum)
Procedure Name: Intubation Date/Time: 06/13/2013 1:07 PM Performed by: Gayla Medicus Pre-anesthesia Checklist: Patient identified, Timeout performed, Emergency Drugs available, Suction available and Patient being monitored Patient Re-evaluated:Patient Re-evaluated prior to inductionOxygen Delivery Method: Circle system utilized Preoxygenation: Pre-oxygenation with 100% oxygen Intubation Type: IV induction Ventilation: Mask ventilation without difficulty LMA: LMA inserted LMA Size: 4.0 Laryngoscope Size: Mac and 3 Grade View: Grade I Tube type: Oral Tube size: 7.5 mm Number of attempts: 1 Airway Equipment and Method: Stylet Placement Confirmation: ETT inserted through vocal cords under direct vision,  positive ETCO2 and breath sounds checked- equal and bilateral Secured at: 22 cm Tube secured with: Tape Dental Injury: Teeth and Oropharynx as per pre-operative assessment  Comments: Unable to get LMA #4 to seat well in patient's airway, decision made to convert to GETA. Grade I view with Mac 3 blade, 7.5 ETT placed easily. Airway manipulation atraumatic. KH    Anesthesia [facility] Block:  Femoral nerve block  Pre-Anesthetic Checklist: ,, timeout performed, Correct Patient, Correct Site, Correct Laterality, Correct Procedure, Correct Position, site marked, Risks and benefits discussed,  Surgical consent,  Pre-op evaluation,  At surgeon's request and post-op pain management  Laterality: Left  Prep: chloraprep       Needles:  Injection technique: Single-shot  Needle Type: Echogenic Stimulator Needle      Needle Gauge: 21 and 21 G    Additional Needles:  Procedures: ultrasound guided (picture in chart) and nerve stimulator Femoral nerve block  Nerve Stimulator or Paresthesia:  Response: 0.4 mA,   Additional Responses:   Narrative:  Start time: 06/13/2013 12:35 PM End time: 06/13/2013 12:50 PM Injection made incrementally with aspirations every 5 mL.  Performed by:  Personally  Anesthesiologist: Arta Bruce MD  Additional Notes: Monitors applied. Patient sedated. Sterile prep and drape,hand hygiene and sterile gloves were used. Relevant anatomy identified.Needle position confirmed.Local anesthetic injected incrementally after negative aspiration. Local anesthetic spread visualized around nerve(s). Vascular puncture avoided. No complications. Image printed for medical record.The patient tolerated the procedure well.       Femoral nerve block

## 2013-06-13 NOTE — Preoperative (Signed)
Beta Blockers   Reason not to administer Beta Blockers:Not Applicable 

## 2013-06-13 NOTE — Op Note (Signed)
PATIENT ID:      Terri York  MRN:     [id number] DOB/AGE:    [birth year] / 62 y.o. y.o.       OPERATIVE REPORT    DATE OF PROCEDURE:  06/13/2013       PREOPERATIVE DIAGNOSIS:   DEGENERATIVE JOINT DISEASE LEFT KNEE      There is no weight on file to calculate BMI.                                                        POSTOPERATIVE DIAGNOSIS:   DEGENERATIVE JOINT DISEASE LEFT KNEE                                                                      PROCEDURE:  Procedure(s): TOTAL KNEE ARTHROPLASTY Using Depuy Sigma RP implants #4L Femur, #4Tibia, 10mm sigma RP bearing, 35 Patella     SURGEON: Bernabe Dorce J    ASSISTANT:   Eric K. [employer]   (Present and scrubbed throughout the case, critical for assistance with exposure, retraction, instrumentation, and closure.)         ANESTHESIA: GET Exparel  DRAINS: foley, 2 medium hemovac in knee   TOURNIQUET TIME:   COMPLICATIONS:  None     SPECIMENS: None   INDICATIONS FOR PROCEDURE: The patient has  DEGENERATIVE JOINT DISEASE LEFT KNEE, varus deformities, XR shows bone on bone arthritis. Patient has failed all conservative measures including anti-inflammatory medicines, narcotics, attempts at  exercise and weight loss, cortisone injections and viscosupplementation.  Risks and benefits of surgery have been discussed, questions answered.   DESCRIPTION OF PROCEDURE: The patient identified by armband, received  IV antibiotics, in the holding area at [facility]. Patient taken to the operating room, appropriate anesthetic  monitors were attached, and general endotracheal anesthesia induced with  the patient in supine position, Foley catheter was inserted. Tourniquet  applied high to the operative thigh. Lateral post and foot positioner  applied to the table, the lower extremity was then prepped and draped  in usual sterile fashion from the ankle to the tourniquet. Time-out procedure was performed. The limb was wrapped with an  Esmarch bandage and the tourniquet inflated to 350 mmHg. We began the operation by making the anterior midline incision starting at handbreadth above the patella going over the patella 1 cm medial to and  4 cm distal to the tibial tubercle. Small bleeders in the skin and the  subcutaneous tissue identified and cauterized. Transverse retinaculum was incised and reflected medially and a medial parapatellar arthrotomy was accomplished. the patella was everted and theprepatellar fat pad resected. The superficial medial collateral  ligament was then elevated from anterior to posterior along the proximal  flare of the tibia and anterior half of the menisci resected. The knee was hyperflexed exposing bone on bone arthritis. Peripheral and notch osteophytes as well as the cruciate ligaments were then resected. We continued to  work our way around posteriorly along the proximal tibia, and externally  rotated the tibia subluxing it out from underneath the femur. A McHale  retractor was placed through the notch and a lateral Hohmann retractor  placed, and we then drilled through the proximal tibia in line with the  axis of the tibia followed by an intramedullary guide rod and 2-degree  posterior slope cutting guide. The tibial cutting guide was pinned into place  allowing resection of 8 mm of bone medially and about 1 mm of bone  laterally because of her varus deformity. Satisfied with the tibial resection, we then  entered the distal femur 2 mm anterior to the PCL origin with the  intramedullary guide rod and applied the distal femoral cutting guide  set at 11mm, with 5 degrees of valgus. This was pinned along the  epicondylar axis. At this point, the distal femoral cut was accomplished without difficulty. We then sized for a #4L femoral component and pinned the guide in 0 degrees of external rotation.The chamfer cutting guide was pinned into place. The anterior, posterior, and chamfer cuts were accomplished  without difficulty followed by  the Sigma RP box cutting guide and the box cut. We also removed posterior osteophytes from the posterior femoral condyles. At this  time, the knee was brought into full extension. We checked our  extension and flexion gaps and found them symmetric at 10mm.  The patella thickness measured at 25 mm. We set the cutting guide at 15 and removed the posterior 10 mm  of the patella, sized for a 25 button and drilled the lollipop. The knee  was then once again hyperflexed exposing the proximal tibia. We sized for a #4 tibial base plate, applied the smokestack and the conical reamer followed by the the Delta fin keel punch. We then hammered into place the Sigma RP trial femoral component, inserted a 10-mm trial bearing, trial patellar button, and took the knee through range of motion from 0-130 degrees. No thumb pressure was required for patellar  tracking. At this point, all trial components were removed, a double batch of DePuy HV cement with 1500 mg of Zinacef was mixed and applied to all bony metallic mating surfaces except for the posterior condyles of the femur itself. In order, we  hammered into place the tibial tray and removed excess cement, the femoral component and removed excess cement, a 10-mm Sigma RP bearing  was inserted, and the knee brought to full extension with compression.  The patellar button was clamped into place, and excess cement  removed. While the cement cured the wound was irrigated out with normal saline solution pulse lavage, and medium Hemovac drains were placed from an anterolateral  approach. Ligament stability and patellar tracking were checked and found to be excellent. The parapatellar arthrotomy was closed with  running #1 Vicryl suture. The subcutaneous tissue with 0 and 2-0 undyed  Vicryl suture, and the skin with skin staples. A dressing of Xeroform,  4 x 4, dressing sponges, Webril, and Ace wrap applied. The patient  awakened,  extubated, and taken to recovery room without difficulty.   [name] 06/13/2013, 7:31 PM

## 2013-06-13 NOTE — Transfer of Care (Signed)
Immediate Anesthesia Transfer of Care Note  Patient: Terri York  Procedure(s) Performed: Procedure(s): TOTAL KNEE ARTHROPLASTY (Left)  Patient Location: PACU  Anesthesia Type:General  Level of Consciousness: awake, alert  and oriented  Airway & Oxygen Therapy: Patient Spontanous Breathing and Patient connected to nasal cannula oxygen  Post-op Assessment: Report given to PACU RN, Post -op Vital signs reviewed and stable and Patient moving all extremities  Post vital signs: Reviewed and stable  Complications: No apparent anesthesia complications

## 2013-06-13 NOTE — Progress Notes (Signed)
Notified dr. Michelle Piper regarding patient blood pressure 209/81. Patient did take her atenolol and norvasc but did not take her lisinopril. No new orders given. Will continue to monitor patient bp closely.

## 2013-06-13 NOTE — Interval H&P Note (Signed)
History and Physical Interval Note:  06/13/2013 12:45 PM  Terri York  has presented today for surgery, with the diagnosis of DEGENERATIVE JOINT DISEASE LEFT KNEE  The various methods of treatment have been discussed with the patient and family. After consideration of risks, benefits and other options for treatment, the patient has consented to  Procedure(s): TOTAL KNEE ARTHROPLASTY (Left) as a surgical intervention .  The patient's history has been reviewed, patient examined, no change in status, stable for surgery.  I have reviewed the patient's chart and labs.  Questions were answered to the patient's satisfaction.     Nestor Lewandowsky

## 2013-06-13 NOTE — Anesthesia Preprocedure Evaluation (Addendum)
Anesthesia Evaluation  Patient identified by MRN, date of birth, ID band Patient awake    Reviewed: Allergy & Precautions, H&P , NPO status , Patient's Chart, lab work & pertinent test results  Airway Mallampati: I TM Distance: >3 FB Neck ROM: Full    Dental   Pulmonary shortness of breath and with exertion, asthma , COPD COPD inhaler, Current Smoker,          Cardiovascular hypertension, Pt. on medications and Pt. on home beta blockers     Neuro/Psych    GI/Hepatic GERD-  Medicated and Controlled,(+)     substance abuse  marijuana use,   Endo/Other    Renal/GU Renal InsufficiencyRenal disease     Musculoskeletal  (+) Arthritis -, Osteoarthritis,    Abdominal   Peds  Hematology   Anesthesia Other Findings   Reproductive/Obstetrics                          Anesthesia Physical Anesthesia Plan  ASA: III  Anesthesia Plan: General   Post-op Pain Management:    Induction: Intravenous  Airway Management Planned: Oral ETT  Additional Equipment:   Intra-op Plan:   Post-operative Plan: Extubation in OR  Informed Consent: I have reviewed the patients History and Physical, chart, labs and discussed the procedure including the risks, benefits and alternatives for the proposed anesthesia with the patient or authorized representative who has indicated his/her understanding and acceptance.   Dental advisory given  Plan Discussed with: CRNA and Surgeon  Anesthesia Plan Comments:       Anesthesia Quick Evaluation

## 2013-06-13 NOTE — Anesthesia Postprocedure Evaluation (Signed)
Anesthesia Post Note  Patient: Terri York  Procedure(s) Performed: Procedure(s) (LRB): TOTAL KNEE ARTHROPLASTY (Left)  Anesthesia type: general  Patient location: PACU  Post pain: Pain level controlled  Post assessment: Patient's Cardiovascular Status Stable  Last Vitals:  Filed Vitals:   06/13/13 1621  BP: 166/89  Pulse: 63  Temp:   Resp: 12    Post vital signs: Reviewed and stable  Level of consciousness: sedated  Complications: No apparent anesthesia complications

## 2013-06-13 NOTE — Progress Notes (Signed)
Orthopedic Tech Progress Note Patient Details:  Terri York 05/18/1951 [id number]  CPM Left Knee CPM Left Knee: On Left Knee Flexion (Degrees): 60 Left Knee Extension (Degrees): 0 Additional Comments: Trapeze bar and  foot roll    Shawnie Pons 06/13/2013, 5:00 PM

## 2013-06-14 ENCOUNTER — Encounter (HOSPITAL_COMMUNITY): Payer: Self-pay | Admitting: General Practice

## 2013-06-14 ENCOUNTER — Inpatient Hospital Stay (HOSPITAL_COMMUNITY): Payer: Medicare Other

## 2013-06-14 LAB — CBC
HCT: 32.1 % — ABNORMAL LOW (ref 36.0–46.0)
MCV: 88.2 fL (ref 78.0–100.0)
RBC: 3.64 MIL/uL — ABNORMAL LOW (ref 3.87–5.11)
RDW: 14 % (ref 11.5–15.5)
WBC: 12.7 10*3/uL — ABNORMAL HIGH (ref 4.0–10.5)

## 2013-06-14 MED ORDER — PNEUMOCOCCAL VAC POLYVALENT 25 MCG/0.5ML IJ INJ
0.5000 mL | INJECTION | Freq: Once | INTRAMUSCULAR | Status: AC
  Administered 2013-06-14: 0.5 mL via INTRAMUSCULAR
  Filled 2013-06-14: qty 0.5

## 2013-06-14 MED ORDER — LORAZEPAM 0.5 MG PO TABS
0.5000 mg | ORAL_TABLET | ORAL | Status: DC | PRN
  Administered 2013-06-14 – 2013-06-16 (×4): 0.5 mg via ORAL
  Filled 2013-06-14 (×4): qty 1

## 2013-06-14 NOTE — Progress Notes (Signed)
OT Cancellation Note  Patient Details Name: Terri York MRN: [id number] DOB: 04/04/1951   Cancelled Treatment:    Reason Eval/Treat Not Completed:  Pt on BSC and bathing with RN.  Will reattempt   Boykin Reaper [phone] 06/14/2013, 2:43 PM

## 2013-06-14 NOTE — Progress Notes (Addendum)
Patient ID: DANILYN COCKE, female   DOB: Aug 15, 1950, 62 y.o.   MRN: [id number] PATIENT ID: JOLETTA MANNER  MRN: [id number]  DOB/AGE:  1951-06-16 / 62 y.o.  1 Day Post-Op Procedure(s) (LRB): TOTAL KNEE ARTHROPLASTY (Left)    PROGRESS NOTE Subjective: Patient is alert, oriented, 2xNausea, 2x Vomiting, yes passing gas, no Bowel Movement. Taking PO sips. Denies SOB, Chest or Calf Pain. Using Incentive Spirometer, PAS in place. Ambulate WBAT, CPM 0-60 Patient reports pain as 9 on 0-10 scale  .    Objective: Vital signs in last 24 hours: Filed Vitals:   06/14/13 0000 06/14/13 0103 06/14/13 0400 06/14/13 0548  BP:  201/103  169/85  Pulse:  67  70  Temp:  98 F (36.7 C)  97.4 F (36.3 C)  TempSrc:  Oral  Oral  Resp: 16 18 19 18   SpO2: 100% 100% 100% 100%      Intake/Output from previous day: I/O last 3 completed shifts: In: 2964.6 [I.V.:2414.6; Other:550] Out: 1765 [Urine:1700; Drains:40; Blood:25]   Intake/Output this shift:     LABORATORY DATA:  Recent Labs  06/14/13 0352  WBC 12.7*  HGB 11.2*  HCT 32.1*  PLT 187    Examination: Neurologically intact ABD soft Neurovascular intact Sensation intact distally Intact pulses distally Dorsiflexion/Plantar flexion intact Incision: no drainage No cellulitis present Compartment soft} Blood and plasma separated in drain indicating minimal recent drainage, drain pulled without difficulty.  Assessment:   1 Day Post-Op Procedure(s) (LRB): TOTAL KNEE ARTHROPLASTY (Left) ADDITIONAL DIAGNOSIS:  COPD, HTN  Plan: PT/OT WBAT, CPM 5/hrs day until ROM 0-90 degrees, then D/C CPM, medications for nausea vomiting and pain. DVT Prophylaxis:  SCDx72hrs, ASA 325 mg BID x 2 weeks DISCHARGE PLAN: Home DISCHARGE NEEDS: HHPT, HHRN, CPM, Walker and 3-in-1 comode seat     Acelynn Dejonge J 06/14/2013, 8:34 AM

## 2013-06-14 NOTE — Evaluation (Signed)
Physical Therapy Evaluation Patient Details Name: Terri York MRN: [id number] DOB: 1951-04-04 Today's Date: 06/14/2013 Time: 4034-7425 PT Time Calculation (min): 20 min  PT Assessment / Plan / Recommendation History of Present Illness  Pt is a 62 y/o female admitted s/p L TKA on 06/13/2013.  Clinical Impression  This patient presents with acute pain and decreased functional independence following the above mentioned procedure. At the time of PT eval, pt very anxious and in increased pain. She required +2 assist for safety as pt is impulsive, but mod assist for actual functional mobility. This patient is appropriate for skilled PT interventions to address functional limitations, improve safety and independence with functional mobility, and return to PLOF.     PT Assessment  Patient needs continued PT services    Follow Up Recommendations  SNF    Does the patient have the potential to tolerate intense rehabilitation      Barriers to Discharge Decreased caregiver support      Equipment Recommendations  Rolling walker with 5" wheels    Recommendations for Other Services     Frequency 7X/week    Precautions / Restrictions Precautions Precautions: Fall;Knee Restrictions Weight Bearing Restrictions: Yes LLE Weight Bearing: Weight bearing as tolerated   Pertinent Vitals/Pain Pt reports extreme pain in L knee however she states the muscle relaxer helped.      Mobility  Bed Mobility Bed Mobility: Supine to Sit;Sitting - Scoot to Edge of Bed Supine to Sit: 3: Mod assist;With rails;HOB elevated Sitting - Scoot to Edge of Bed: 3: Mod assist Details for Bed Mobility Assistance: Heavy assist for support of the LLE, as pt was very anxious  Transfers Transfers: Sit to Stand;Stand to Sit Sit to Stand: 3: Mod assist;From bed;With upper extremity assist Stand to Sit: 4: Min assist;To chair/3-in-1;With upper extremity assist Details for Transfer Assistance: VC's for hand placement  on seated surface and for L foot position for comfort when descending to chair. Ambulation/Gait Ambulation/Gait Assistance: 4: Min assist Ambulation Distance (Feet): 3 Feet Assistive device: Rolling walker Ambulation/Gait Assistance Details: Assist for movement and placement on the RW during turn to the chair. Pt very timid about putting weight through the operative leg. Gait Pattern: Step-to pattern;Decreased stride length;Narrow base of support Gait velocity: decreased Wheelchair Mobility Wheelchair Mobility: No    Exercises     PT Diagnosis: Difficulty walking;Acute pain  PT Problem List: Decreased strength;Decreased range of motion;Decreased activity tolerance;Decreased balance;Decreased mobility;Decreased knowledge of use of DME;Decreased safety awareness;Pain PT Treatment Interventions: DME instruction;Gait training;Stair training;Functional mobility training;Therapeutic activities;Therapeutic exercise;Neuromuscular re-education;Patient/family education     PT Goals(Current goals can be found in the care plan section) Acute Rehab PT Goals Patient Stated Goal: To return to PLOF PT Goal Formulation: With patient Time For Goal Achievement: 06/21/13 Potential to Achieve Goals: Good  Visit Information  Last PT Received On: 06/14/13 Assistance Needed: +2 (Pt is very anxious) History of Present Illness: Pt is a 62 y/o female admitted s/p L TKA on 06/13/2013.       Prior Functioning  Home Living Family/patient expects to be discharged to:: Skilled nursing facility Living Arrangements: Alone Prior Function Level of Independence: Independent Communication Communication: No difficulties Dominant Hand: Right    Cognition  Cognition Arousal/Alertness: Awake/alert Behavior During Therapy: WFL for tasks assessed/performed Overall Cognitive Status: Within Functional Limits for tasks assessed    Extremity/Trunk Assessment Upper Extremity Assessment Upper Extremity Assessment:  Overall WFL for tasks assessed Lower Extremity Assessment Lower Extremity Assessment: LLE deficits/detail LLE Deficits /  Details: Decreased strength and AROM consistent with L TKA LLE: Unable to fully assess due to pain Cervical / Trunk Assessment Cervical / Trunk Assessment: Normal   Balance Balance Balance Assessed: No  End of Session PT - End of Session Equipment Utilized During Treatment: Gait belt Activity Tolerance: Patient limited by pain Patient left: in chair;with call bell/phone within reach Nurse Communication: Mobility status CPM Left Knee CPM Left Knee: Off  GP     Ruthann Cancer 06/14/2013, 1:05 PM  Ruthann Cancer, PT, DPT [phone]

## 2013-06-14 NOTE — Progress Notes (Signed)
UR COMPLETED  

## 2013-06-14 NOTE — Progress Notes (Addendum)
Nutrition Brief Note  Patient identified on the Malnutrition Screening Tool (MST) Report  Per pt she lost about 10-15 lb PTA as instructed by her physician. Pt with no recent unintentional weight loss PTA. Pt c/o pain, notified RN who has already given pain mediation recently.   Wt Readings from Last 15 Encounters:  06/06/13 184 lb (83.462 kg)    BMI: 30.7 Patient meets criteria for obesity based on current BMI.   Current diet order is Clear Liquids. Labs and medications reviewed.   No nutrition interventions warranted at this time. If nutrition issues arise, please consult RD.   Kendell Bane RD, LDN, CNSC [phone] Pager [phone] After Hours Pager

## 2013-06-14 NOTE — Progress Notes (Signed)
Physical Therapy Progress Note   06/14/13 1502  PT Visit Information  Last PT Received On 06/14/13  Assistance Needed +2 (Pt is anxious/impulsive)  History of Present Illness Pt is a 62 y/o female admitted s/p L TKA on 06/13/2013.  PT Time Calculation  PT Start Time 1445  PT Stop Time 1500  PT Time Calculation (min) 15 min  Subjective Data  Subjective "It hurts, it hurts!"  Patient Stated Goal To return to PLOF  Precautions  Precautions Fall;Knee  Restrictions  Weight Bearing Restrictions Yes  LLE Weight Bearing WBAT  Cognition  Arousal/Alertness Awake/alert  Behavior During Therapy WFL for tasks assessed/performed  Overall Cognitive Status Within Functional Limits for tasks assessed  Bed Mobility  Bed Mobility Supine to Sit  Details for Bed Mobility Assistance Pt demonstrated supine to long-sitting with modified independence and use of bed rail, however refused to sit up on the edge of the bed.  Transfers  Transfers Not assessed  Details for Transfer Assistance Pt refused any OOB activity at this time.  Balance  Balance Assessed No  Exercises  Exercises Total Joint  Total Joint Exercises  Ankle Circles/Pumps 10 reps  Quad Sets 10 reps  Gluteal Sets 10 reps  Heel Slides 5 reps  PT - End of Session  Activity Tolerance Patient limited by pain  Patient left in bed;with call bell/phone within reach  Nurse Communication Mobility status  PT - Assessment/Plan  PT Plan Current plan remains appropriate  PT Frequency 7X/week  Follow Up Recommendations SNF  PT equipment Rolling walker with 5" wheels  PT Goal Progression  Progress towards PT goals Progressing toward goals  Acute Rehab PT Goals  PT Goal Formulation With patient  Time For Goal Achievement 06/21/13  Potential to Achieve Goals Fair  PT General Charges  $$ ACUTE PT VISIT 1 Procedure  PT Treatments  $Therapeutic Exercise 8-22 mins    Assessment: Pt refuses any OOB activity at this time, stating she is in too  much pain. Pt would not allow AAROM for therapeutic exercise and active movement was very minimal. Goniometric measurement not completed at this time. Therapist discussed need for skilled nursing at d/c and pt agreeable.   Ruthann Cancer, PT, DPT [phone]

## 2013-06-14 NOTE — Plan of Care (Signed)
Problem: Consults Goal: Diagnosis- Total Joint Replacement Primary Total Knee Left     

## 2013-06-15 ENCOUNTER — Encounter (HOSPITAL_COMMUNITY): Payer: Self-pay | Admitting: Orthopedic Surgery

## 2013-06-15 LAB — CBC
HCT: 29.2 % — ABNORMAL LOW (ref 36.0–46.0)
MCH: 30.7 pg (ref 26.0–34.0)
MCHC: 35.6 g/dL (ref 30.0–36.0)
Platelets: 158 10*3/uL (ref 150–400)
RDW: 13.9 % (ref 11.5–15.5)
WBC: 12.8 10*3/uL — ABNORMAL HIGH (ref 4.0–10.5)

## 2013-06-15 NOTE — Progress Notes (Signed)
PATIENT ID: Terri York  MRN: [id number]  DOB/AGE:  09/13/1950 / 62 y.o.  2 Days Post-Op Procedure(s) (LRB): TOTAL KNEE ARTHROPLASTY (Left)    PROGRESS NOTE Subjective: Patient is alert, oriented, no Nausea, no Vomiting, yes passing gas, yes Bowel Movement. Taking PO well. Denies SOB, Chest or Calf Pain. Using Incentive Spirometer, PAS in place. Ambulate WBAT, CPM 0-60 Patient reports pain as severe  .    Objective: Vital signs in last 24 hours: Filed Vitals:   06/14/13 2125 06/14/13 2347 06/15/13 0400 06/15/13 0631  BP: 159/85   152/86  Pulse: 65   75  Temp: 97.6 F (36.4 C)   97.7 F (36.5 C)  TempSrc:      Resp: 18 17 16 18   SpO2: 99%   99%      Intake/Output from previous day: I/O last 3 completed shifts: In: 2710.4 [I.V.:2160.4; Other:550] Out: 1440 [Urine:1400; Drains:40]   Intake/Output this shift:     LABORATORY DATA:  Recent Labs  06/14/13 0352 06/15/13 0535  WBC 12.7* 12.8*  HGB 11.2* 10.4*  HCT 32.1* 29.2*  PLT 187 158    Examination: Neurologically intact Neurovascular intact Dorsiflexion/Plantar flexion intact Incision: scant drainage No cellulitis present Compartment soft}  Assessment:   2 Days Post-Op Procedure(s) (LRB): TOTAL KNEE ARTHROPLASTY (Left) ADDITIONAL DIAGNOSIS:  COPD, HTN  Plan: PT/OT WBAT, CPM 5/hrs day until ROM 0-90 degrees, then D/C CPM DVT Prophylaxis:  SCDx72hrs, ASA 325 mg BID x 2 weeks DISCHARGE PLAN: Home when pt passes PT goals. DISCHARGE NEEDS: HHPT, HHRN, CPM, Walker and 3-in-1 comode seat     Salia Cangemi R 06/15/2013, 7:21 AM

## 2013-06-15 NOTE — Progress Notes (Signed)
Physical Therapy Treatment Patient Details Name: Terri York MRN: [id number] DOB: Aug 01, 1951 Today's Date: 06/15/2013 Time: 7829-5621 PT Time Calculation (min): 28 min  PT Assessment / Plan / Recommendation  History of Present Illness Pt is a 62 y/o female admitted s/p L TKA on 06/13/2013.   PT Comments   Pt making slow progress towards physical therapy goals. Safety awareness is main concern of pt returning home, and recommendation from PT continues to be SNF. Pt has 3 steps to enter her home and needs to practice every session prior to d/c. She reports that steps at home are high, and stairwell may be best place to practice 3 higher steps in a row to simulate the home environment. +2 recommended for chair follow and safety with step training.   Follow Up Recommendations  SNF     Does the patient have the potential to tolerate intense rehabilitation     Barriers to Discharge        Equipment Recommendations       Recommendations for Other Services    Frequency 7X/week   Progress towards PT Goals Progress towards PT goals: Progressing toward goals  Plan Current plan remains appropriate    Precautions / Restrictions Precautions Precautions: Fall;Knee Restrictions Weight Bearing Restrictions: Yes LLE Weight Bearing: Weight bearing as tolerated   Pertinent Vitals/Pain Pt does not rate pain on 0-10 scale however yells in pain every time the knee is bent during functional mobility. Per nursing, pt had pain medication prior to session beginning.     Mobility  Bed Mobility Bed Mobility: Supine to Sit;Sitting - Scoot to Edge of Bed Supine to Sit: 4: Min assist;With rails;HOB flat Sitting - Scoot to [place] of Bed: 4: Min assist Details for Bed Mobility Assistance: Assist for LLE support, with frequent VC's for safety awareness with pt scooting too far to EOB. Risk of falling out of the bed trying to scoot out far enough to rest her foot on the floor without bending the  knee. Transfers Transfers: Sit to Stand;Stand to Sit Sit to Stand: 4: Min assist;With upper extremity assist;From bed Stand to Sit: 4: Min assist;To chair/3-in-1;With upper extremity assist Details for Transfer Assistance: VC's for safe hand placement on seated surface, before and after transfers. Pt requires frequent cueing to keep hands on the walker when at full stand, without leaning forearms on the walker. Ambulation/Gait Ambulation/Gait Assistance: 4: Min assist Ambulation Distance (Feet): 15 Feet Assistive device: Rolling walker Ambulation/Gait Assistance Details: Min assist for walker placement. Frequent VC's for hand placement on the walker without leaning upper body and forearms on the walker handles for a break. Gait Pattern: Step-to pattern;Decreased stride length;Narrow base of support Gait velocity: decreased Stairs: Yes Stairs Assistance: 3: Mod assist Stairs Assistance Details (indicate cue type and reason): VC's for sequencing and hand placement. Pt kept LLE NWB while descending the steps even when cued to bear weight through that LE. Mod assist for support especially when ascending the stairs.  Stair Management Technique: Two rails;Forwards Number of Stairs: 2    Exercises     PT Diagnosis:    PT Problem List:   PT Treatment Interventions:     PT Goals (current goals can now be found in the care plan section) Acute Rehab PT Goals Patient Stated Goal: To return to PLOF PT Goal Formulation: With patient Time For Goal Achievement: 06/21/13 Potential to Achieve Goals: Fair  Visit Information  Last PT Received On: 06/15/13 Assistance Needed: +2 History of Present Illness:  Pt is a 62 y/o female admitted s/p L TKA on 06/13/2013.    Subjective Data  Subjective: "I don't wanna go no where but home." Patient Stated Goal: To return to PLOF   Cognition  Cognition Arousal/Alertness: Awake/alert Behavior During Therapy: WFL for tasks assessed/performed Overall  Cognitive Status: Within Functional Limits for tasks assessed    Balance  Balance Balance Assessed: Yes Static Sitting Balance Static Sitting - Balance Support: Feet supported;Bilateral upper extremity supported Static Sitting - Level of Assistance: 5: Stand by assistance Static Standing Balance Static Standing - Balance Support: Bilateral upper extremity supported Static Standing - Level of Assistance: 4: Min assist Dynamic Standing Balance Dynamic Standing - Balance Support: Bilateral upper extremity supported Dynamic Standing - Level of Assistance: 4: Min assist  End of Session PT - End of Session Equipment Utilized During Treatment: Gait belt Activity Tolerance: Patient limited by pain Patient left: in chair;Other (comment) (OT present) Nurse Communication: Mobility status   GP     Ruthann Cancer 06/15/2013, 3:20 PM  Ruthann Cancer, PT, DPT [phone]

## 2013-06-15 NOTE — Progress Notes (Signed)
Patient reported that she wants to go home instead of SNF. Clinical Social Worker will sign off for now as social work intervention is no longer needed. Please consult [place] again if new need arises.    Sabino Niemann, MSW, [employer] [phone]

## 2013-06-15 NOTE — Progress Notes (Signed)
06/15/13 Patient has decided to go home rather than to SNF. She states that she will have friends or family staying with her.Informed Treveon Bourcier with [facility] and Kipp Brood with [employer] that plan has changed back to home. Informed Kipp Brood that patient needs tub bench. No other d/c needs identified. Jacquelynn Cree RN, BSN, CCM  06/15/13 Informed Kipp Brood with [employer] and [place] with [facility] that patient going to d/c to SNF rather than home. Jacquelynn Cree RN, BSN, CCM  06/14/13 Set up with HHPT,OT and RN with [facility] by MD office. PT recommending SNF. Referral made to CSW.Jacquelynn Cree RN, BSN, CCM

## 2013-06-15 NOTE — Evaluation (Signed)
Occupational Therapy Evaluation and Discharge Patient Details Name: Terri York MRN: [id number] DOB: 1951-03-20 Today's Date: 06/15/2013 Time: 4098-1191 OT Time Calculation (min): 27 min  OT Assessment / Plan / Recommendation History of present illness Pt is a 62 y/o female admitted s/p L TKA on 06/13/2013.   Clinical Impression   This 62 yo female admitted and underwent above presents to acute OT with all acute OT education completed, will D/C from acute with follow up HHOT.    OT Assessment  All further OT needs can be met in the next venue of care    Follow Up Recommendations  Home health OT       Equipment Recommendations  3 in 1 bedside comode;Tub/shower bench          Precautions / Restrictions Precautions Precautions: Fall;Knee Restrictions LLE Weight Bearing: Weight bearing as tolerated   Pertinent Vitals/Pain 8/10 in left kneel; pre-medicated per pt    ADL  Eating/Feeding: Independent Where Assessed - Eating/Feeding: Chair Grooming: Set up Where Assessed - Grooming: Unsupported sitting Upper Body Bathing: Set up Where Assessed - Upper Body Bathing: Supported sitting Lower Body Bathing: Minimal assistance Where Assessed - Lower Body Bathing: Supported sit to stand Upper Body Dressing: Set up Where Assessed - Upper Body Dressing: Unsupported sitting Lower Body Dressing: Minimal assistance Where Assessed - Lower Body Dressing: Supported sit to Pharmacist, hospital: Minimal assistance Toilet Transfer Method: Sit to Barista:  (Reclliner>tub bench>recliner) Toileting - Architect and Hygiene: Min guard Where Assessed - Engineer, mining and Hygiene: Sit to stand from 3-in-1 or toilet Tub/Shower Transfer: Minimal assistance (for LLE) Tub/Shower Transfer Method: Science writer: Counsellor Used: Gait belt;Rolling walker Transfers/Ambulation Related to ADLs: min A for  all with RW ADL Comments: Went over AE with pt and how to use it with pt doffing and donning her socks with it. Issued pt an AE kit due to secondary insurance is Medicaid    OT Diagnosis: Generalized weakness;Acute pain  OT Problem List: Decreased strength;Decreased range of motion;Decreased activity tolerance;Impaired balance (sitting and/or standing);Pain;Decreased knowledge of use of DME or AE OT Treatment Interventions:     OT Goals(Current goals can be found in the care plan section) Acute Rehab OT Goals Patient Stated Goal: Home tomorrow  Visit Information  Last OT Received On: 06/15/13 Assistance Needed: +1 History of Present Illness: Pt is a 62 y/o female admitted s/p L TKA on 06/13/2013.       Prior Functioning     Home Living Family/patient expects to be discharged to:: Private residence Living Arrangements: Alone Available Help at Discharge: Family;Friend(s);Available PRN/intermittently Type of Home: House Prior Function Level of Independence: Independent Communication Communication: No difficulties Dominant Hand: Right         Vision/Perception Vision - History Patient Visual Report: No change from baseline   Cognition  Cognition Arousal/Alertness: Awake/alert Behavior During Therapy: WFL for tasks assessed/performed Overall Cognitive Status: Within Functional Limits for tasks assessed    Extremity/Trunk Assessment Upper Extremity Assessment Upper Extremity Assessment: Overall WFL for tasks assessed     Mobility Bed Mobility Details for Bed Mobility Assistance: Pt up in recliner upon arrival Transfers Transfers: Sit to Stand;Stand to Sit Sit to Stand: 4: Min assist;With upper extremity assist;From chair/3-in-1 Stand to Sit: 4: Min assist;With upper extremity assist;With armrests;To chair/3-in-1 Details for Transfer Assistance: VCs for safe hand placement           End of Session OT -  End of Session Equipment Utilized During Treatment: Gait  belt;Rolling walker Activity Tolerance: Patient limited by pain Patient left: in chair;with call bell/phone within reach       Evette Georges [phone] 06/15/2013, 2:37 PM

## 2013-06-15 NOTE — Progress Notes (Signed)
Physical Therapy Treatment Patient Details Name: Terri York MRN: [id number] DOB: 1950-10-28 Today's Date: 06/15/2013 Time: 7829-5621 PT Time Calculation (min): 26 min  PT Assessment / Plan / Recommendation  History of Present Illness Pt is a 62 y/o female admitted s/p L TKA on 06/13/2013.   PT Comments   Pt making slow progress towards physical therapy goals. She was able to improve gait distance, however gait sequencing and technique continue to be limited due to pain. Pt refused the chair and CPM at end of session, and pt was left supine in bed with towel roll under ankle to promote continued knee extension.   Follow Up Recommendations  SNF     Does the patient have the potential to tolerate intense rehabilitation     Barriers to Discharge        Equipment Recommendations  None recommended by PT    Recommendations for Other Services    Frequency 7X/week   Progress towards PT Goals Progress towards PT goals: Progressing toward goals  Plan Current plan remains appropriate    Precautions / Restrictions Precautions Precautions: Fall;Knee Restrictions Weight Bearing Restrictions: Yes LLE Weight Bearing: Weight bearing as tolerated   Pertinent Vitals/Pain 7/10 at rest    Mobility  Bed Mobility Bed Mobility: Supine to Sit;Sitting - Scoot to [place] of Bed;Sit to Supine;Scooting to [facility] Supine to Sit: 4: Min assist;With rails;HOB elevated Sitting - Scoot to Edge of Bed: 4: Min guard Sit to Supine: 3: Mod assist;HOB flat Scooting to [facility]: 5: Supervision Details for Bed Mobility Assistance: Able to come to long sitting and rotate her body around to the EOB. Pt requires min to mod assist for support of the LLE when going to/from EOB due to pain. Transfers Transfers: Sit to Stand;Stand to Sit Sit to Stand: 4: Min assist;From bed;With upper extremity assist Stand to Sit: 4: Min assist;To bed;With upper extremity assist Details for Transfer Assistance: VC's for hand placement on  seated surface prior to initiating transfers. Pt also cued for proper hand placement on walker when coming to full stand, as she was leaning forward onto front bar of walker instead of on the sides. Ambulation/Gait Ambulation/Gait Assistance: 4: Min assist Ambulation Distance (Feet): 15 Feet Assistive device: Rolling walker Ambulation/Gait Assistance Details: Occasional min assist for movement and placement of RW during turns. VC's for sequencing and safety awareness with the RW. Pt also cued for increased step/stride length Gait Pattern: Step-to pattern;Decreased stride length;Narrow base of support Gait velocity: decreased Stairs: No Wheelchair Mobility Wheelchair Mobility: No    Exercises Total Joint Exercises Quad Sets: 10 reps Heel Slides: 10 reps Goniometric ROM: 68 knee flexion AAROM   PT Diagnosis:    PT Problem List:   PT Treatment Interventions:     PT Goals (current goals can now be found in the care plan section) Acute Rehab PT Goals Patient Stated Goal: To return to PLOF PT Goal Formulation: With patient Time For Goal Achievement: 06/21/13 Potential to Achieve Goals: Fair  Visit Information  Last PT Received On: 06/15/13 Assistance Needed: +1 History of Present Illness: Pt is a 62 y/o female admitted s/p L TKA on 06/13/2013.    Subjective Data  Subjective: "It feels better today but I'm still in a lot of pain." Patient Stated Goal: To return to PLOF   Cognition  Cognition Arousal/Alertness: Awake/alert Behavior During Therapy: WFL for tasks assessed/performed Overall Cognitive Status: Within Functional Limits for tasks assessed    Balance  Balance Balance Assessed: Yes Static  Sitting Balance Static Sitting - Balance Support: Feet supported;Bilateral upper extremity supported Static Sitting - Level of Assistance: 5: Stand by assistance Static Sitting - Comment/# of Minutes: 5 minutes during seated exercise Static Standing Balance Static Standing -  Balance Support: Bilateral upper extremity supported Static Standing - Level of Assistance: 4: Min assist Static Standing - Comment/# of Minutes: 1 Dynamic Standing Balance Dynamic Standing - Balance Support: Bilateral upper extremity supported Dynamic Standing - Level of Assistance: 4: Min assist Dynamic Standing - Comments: Occasional LOB during gait (possibly due to pain) with minimal assist to recover.  End of Session PT - End of Session Equipment Utilized During Treatment: Gait belt Activity Tolerance: Patient limited by pain Patient left: in bed;with call bell/phone within reach Nurse Communication: Mobility status   GP     Ruthann Cancer 06/15/2013, 10:21 AM  Ruthann Cancer, PT, DPT [phone]

## 2013-06-16 LAB — CBC
Hemoglobin: 9.7 g/dL — ABNORMAL LOW (ref 12.0–15.0)
MCH: 29.9 pg (ref 26.0–34.0)
Platelets: 162 10*3/uL (ref 150–400)
RBC: 3.24 MIL/uL — ABNORMAL LOW (ref 3.87–5.11)
RDW: 13.5 % (ref 11.5–15.5)
WBC: 12.4 10*3/uL — ABNORMAL HIGH (ref 4.0–10.5)

## 2013-06-16 NOTE — Progress Notes (Signed)
Physical Therapy Treatment Patient Details Name: Terri York MRN: [id number] DOB: 1951-06-10 Today's Date: 06/16/2013 Time: 4098-1191 PT Time Calculation (min): 33 min  PT Assessment / Plan / Recommendation  History of Present Illness Pt is a 62 y/o female admitted s/p L TKA on 06/13/2013.   PT Comments   Pt L knee ROM greatly limited by pain. Pt educated on importance of there ex and ROM. Pt with improved ambulation tolerance this date however requires max directional v/c's for proper sequencing. Pt completed 2 steps this date with 2 handrails however will practice again today. Pt motivated to return home and is aware she needs to do her exercises and be able to WB more on L LE. PT to see again today.    Follow Up Recommendations  SNF     Does the patient have the potential to tolerate intense rehabilitation     Barriers to Discharge        Equipment Recommendations  Rolling walker with 5" wheels    Recommendations for Other Services    Frequency 7X/week   Progress towards PT Goals Progress towards PT goals: Progressing toward goals  Plan Current plan remains appropriate    Precautions / Restrictions Precautions Precautions: Fall;Knee Restrictions LLE Weight Bearing: Weight bearing as tolerated   Pertinent Vitals/Pain 8/10 L knee pain    Mobility  Bed Mobility Bed Mobility: Supine to Sit;Sitting - Scoot to Edge of Bed Supine to Sit: 5: Supervision;HOB flat Sitting - Scoot to [place] of Bed: 4: Min assist Details for Bed Mobility Assistance: pt self assists L LE off EOB, then minA by PT for L LE to scoot to EOB Transfers Transfers: Sit to Stand;Stand to Sit Sit to Stand: 4: Min assist;With upper extremity assist;From bed Stand to Sit: 4: Min assist;To chair/3-in-1;With upper extremity assist Details for Transfer Assistance: v/c's for safe hand placement. pt with significant trunk flexion, pt c/o L Knee pain during transfers Ambulation/Gait Ambulation/Gait Assistance:  4: Min assist;4: Min guard Ambulation Distance (Feet): 50 Feet Assistive device: Rolling walker Ambulation/Gait Assistance Details: max directional v/c's for sequencing and how to advance L LE. Pt swinging L LE thru via raising on R toes. Max encouragement required for L LE WB. pt unable to achieve L knee terminal extension Gait Pattern: Step-to pattern;Decreased stride length;Narrow base of support Gait velocity: decreased Stairs: Yes Stairs Assistance: 4: Min assist Stairs Assistance Details (indicate cue type and reason): strong use of railings Stair Management Technique: Two rails;Step to pattern Number of Stairs: 2    Exercises Total Joint Exercises Quad Sets: AROM;Left;10 reps;Supine Heel Slides: Left;10 reps;Supine Goniometric ROM: 35 AAROM L knee   PT Diagnosis:    PT Problem List:   PT Treatment Interventions:     PT Goals (current goals can now be found in the care plan section)    Visit Information  Last PT Received On: 06/16/13 Assistance Needed: +1 History of Present Illness: Pt is a 62 y/o female admitted s/p L TKA on 06/13/2013.    Subjective Data  Subjective: "I'm going home"   Cognition  Cognition Arousal/Alertness: Awake/alert Behavior During Therapy: WFL for tasks assessed/performed Overall Cognitive Status: Within Functional Limits for tasks assessed    Balance     End of Session PT - End of Session Equipment Utilized During Treatment: Gait belt Activity Tolerance: Patient limited by pain Patient left: in chair;Other (comment) Nurse Communication: Mobility status   GP     Marcene Brawn 06/16/2013, 11:04 AM

## 2013-06-16 NOTE — Progress Notes (Signed)
Physical Therapy Treatment Note  Comments: Pt with improved transfer and ambulation tolerance as well as stair negotiation. Pt con't to require 24/7 assist/supervision for safe d/c home. Pt reports she will have 4 people today when she goes home and her girlfriend to stay with her tonight until her daughter arrives from [place] tomorrow morning. Pt to benefit from HHPT to maximize L knee ROM and strengthening to progress to safe mod I function.   06/16/13 1245  PT Visit Information  Last PT Received On 06/16/13  Assistance Needed +1  History of Present Illness Pt is a 62 y/o female admitted s/p L TKA on 06/13/2013.  PT Time Calculation  PT Start Time 1245  PT Stop Time 1326  PT Time Calculation (min) 41 min  Precautions  Precautions Fall;Knee  Restrictions  LLE Weight Bearing WBAT  Cognition  Arousal/Alertness Awake/alert  Behavior During Therapy WFL for tasks assessed/performed  Overall Cognitive Status Within Functional Limits for tasks assessed  Bed Mobility  Bed Mobility Supine to Sit;Sitting - Scoot to [place] of Bed;Sit to Supine  Supine to Sit 5: Supervision;HOB flat  Sitting - Scoot to Edge of Bed 5: Supervision  Sit to Supine 4: Min assist;HOB flat  Details for Bed Mobility Assistance pt able to bring L LE off EOB in controlled manner without use of UEs with max directional v/c's. Pt did require assist for L LE management to bring L LE back into bed upon return to supine. educated on how to use R UE to assist however pt remained unable to complete indep.  Transfers  Transfers Sit to Stand;Stand to Sit  Sit to Stand 4: Min guard;With upper extremity assist;From bed  Stand to Sit 5: Supervision;With upper extremity assist;To chair/3-in-1  Details for Transfer Assistance pt con't to have significant trunk flexion due to reluctancy to place weight on L LE  Ambulation/Gait  Ambulation/Gait Assistance 4: Min guard  Ambulation Distance (Feet) 60 Feet (x2)  Assistive device Rolling  walker  Ambulation/Gait Assistance Details v/c's to limited L LE propulsion via trunk extension. Pt did increased L LE WBing approx 50% of time. Pt c/o of UE cramps. educated pt on increasing L LE wbing pt reports "I get cramps frequently, even before surgery." Pt with no episodes of LOB or knee flexion. Pt con't to have excessive L knee flexion.  Gait Pattern Step-to pattern;Decreased stance time - left;Decreased stride length;Left hip hike;Left flexed knee in stance  Gait velocity decreased  Stairs Yes  Stairs Assistance 4: Min guard  Stairs Assistance Details (indicate cue type and reason) pt con't to rely on bilat UEs however safe  Stair Management Technique Two rails;Step to pattern  Number of Stairs 2 (pt reports she will have 4 people to help her at home)  Exercises  Exercises Total Joint  Total Joint Exercises  Quad Sets AROM;Left;15 reps;Supine  Short Arc Quad AROM;10 reps;Supine;Left (unable to raise heel from bed)  Heel Slides AAROM;Left;15 reps;Supine  Goniometric ROM 50 deg AAROM into flex  PT - End of Session  Equipment Utilized During Treatment Gait belt  Activity Tolerance Patient limited by pain  Patient left in CPM;in bed (CPM set to 65 deg - pt tolerating well)  Nurse Communication Mobility status  PT - Assessment/Plan  PT Plan Discharge plan needs to be updated  PT Frequency 7X/week  Follow Up Recommendations Home health PT;Supervision/Assistance - 24 hour (pt reports daughter to be coming from [place] to stay with her)  PT equipment Rolling walker with 5"  wheels;3in1 (PT)  PT Goal Progression  Progress towards PT goals Progressing toward goals  PT General Charges  $$ ACUTE PT VISIT 1 Procedure  PT Treatments  $Gait Training 8-22 mins  $Therapeutic Exercise 23-37 mins   Pain: 5/10 L knee pain  Lewis Shock, PT, DPT Pager #: [phone] Office #: [phone]

## 2013-06-16 NOTE — Discharge Summary (Signed)
Patient ID: Terri York MRN: [id number] DOB/AGE: 1950-11-23 62 y.o.  Admit date: 06/13/2013 Discharge date: 06/16/2013  Admission Diagnoses:  Principal Problem:   Arthritis of knee, left   Discharge Diagnoses:  Same  Past Medical History  Diagnosis Date  . Hypertension   . Asthma   . COPD (chronic obstructive pulmonary disease)   . GERD (gastroesophageal reflux disease)   . High cholesterol   . Exertional shortness of breath   . Orthopnea   . Tuberculosis 1996    "took RX for 6 months and that was the end of that" (06/14/2013)  . Osteoarthritis   . Chronic kidney disease (CKD), stage III (moderate)     stage III (according to 04/2013 Dr. Georgena Spurling notes)    Surgeries: Procedure(s): TOTAL KNEE ARTHROPLASTY on 06/13/2013   Consultants:    Discharged Condition: Improved  Hospital Course: Terri York is an 62 y.o. female who was admitted 06/13/2013 for operative treatment ofArthritis of knee, left. Patient has severe unremitting pain that affects sleep, daily activities, and work/hobbies. After pre-op clearance the patient was taken to the operating room on 06/13/2013 and underwent  Procedure(s): TOTAL KNEE ARTHROPLASTY.    Patient was given perioperative antibiotics: Anti-infectives   Start     Dose/Rate Route Frequency Ordered Stop   06/13/13 1409  cefUROXime (ZINACEF) injection  Status:  Discontinued       As needed 06/13/13 1409 06/13/13 1520   06/13/13 0600  ceFAZolin (ANCEF) IVPB 2 g/50 mL premix     2 g 100 mL/hr over 30 Minutes Intravenous On call to O.R. 06/12/13 1429 06/13/13 1315       Patient was given sequential compression devices, early ambulation, and chemoprophylaxis to prevent DVT.  Patient benefited maximally from hospital stay and there were no complications.    Recent vital signs: Patient Vitals for the past 24 hrs:  BP Temp Pulse Resp SpO2  06/16/13 0610 146/77 mmHg 98.7 F (37.1 C) 85 18 99 %  06/16/13 0400 - - - 18 -  06/16/13 0000 - - -  17 -  06/15/13 2000 - - - 16 -     Recent laboratory studies:  Recent Labs  06/15/13 0535 06/16/13 0405  WBC 12.8* 12.4*  HGB 10.4* 9.7*  HCT 29.2* 27.9*  PLT 158 162     Discharge Medications:     Medication List    STOP taking these medications       aspirin 81 MG chewable tablet  Replaced by:  aspirin EC 325 MG tablet     HYDROcodone-acetaminophen 7.5-325 MG per tablet  Commonly known as:  NORCO      TAKE these medications       amLODipine 10 MG tablet  Commonly known as:  NORVASC  Take 10 mg by mouth daily.     aspirin EC 325 MG tablet  Take 1 tablet (325 mg total) by mouth 2 (two) times daily.     atenolol 25 MG tablet  Commonly known as:  TENORMIN  Take 25 mg by mouth daily.     budesonide-formoterol 160-4.5 MCG/ACT inhaler  Commonly known as:  SYMBICORT  Inhale 2 puffs into the lungs 2 (two) times daily.     lisinopril 40 MG tablet  Commonly known as:  PRINIVIL,ZESTRIL  Take 40 mg by mouth daily.     methocarbamol 500 MG tablet  Commonly known as:  ROBAXIN  Take 1 tablet (500 mg total) by mouth 2 (two) times daily with a meal.  multivitamin with minerals Tabs tablet  Take 1 tablet by mouth daily.     omeprazole 40 MG capsule  Commonly known as:  PRILOSEC  Take 40 mg by mouth daily.     oxyCODONE-acetaminophen 5-325 MG per tablet  Commonly known as:  ROXICET  Take 1 tablet by mouth every 4 (four) hours as needed.     pravastatin 40 MG tablet  Commonly known as:  PRAVACHOL  Take 40 mg by mouth daily.        Diagnostic Studies: Dg Chest 2 View  06/06/2013   CLINICAL DATA:  Preop knee arthroplasty.  EXAM: CHEST  2 VIEW  COMPARISON:  None.  FINDINGS: Trachea is midline. Heart size normal. Lungs are mildly hyperinflated but otherwise clear. No pleural fluid. Old bilateral rib fractures.  IMPRESSION: No acute findings.   Electronically Signed   By: Leanna Battles M.D.   On: 06/06/2013 15:08   Dg Knee 1-2 Views Left  06/14/2013    CLINICAL DATA:  Severe pain after total knee arthroplasty 1 day previously.  EXAM: LEFT KNEE - 1-2 VIEW  COMPARISON:  None.  FINDINGS: The femoral and tibial prosthetic components, as well as the patellar component, appear well seated and aligned. There is no acute fracture.  Soft tissue edema and air extends along the anteromedial aspect of the knee, consistent with the expected postoperative change. There is evidence of a moderate to large joint effusion above the patella.  IMPRESSION: Knee prosthesis well seated and aligned. No acute fracture.  Postoperative soft tissue edema and air.  Joint effusion is suggested.   Electronically Signed   By: Amie Portland M.D.   On: 06/14/2013 11:17    Disposition: 01-Home or Self Care      Discharge Orders   Future Orders Complete By Expires   Call MD / Call 911  As directed    Comments:     If you experience chest pain or shortness of breath, CALL 911 and be transported to the hospital emergency room.  If you develope a fever above 101 F, pus (white drainage) or increased drainage or redness at the wound, or calf pain, call your surgeon's office.   Change dressing  As directed    Comments:     Change dressing on POD #5.  You may clean the incision with alcohol prior to redressing.   Constipation Prevention  As directed    Comments:     Drink plenty of fluids.  Prune juice may be helpful.  You may use a stool softener, such as Colace (over the counter) 100 mg twice a day.  Use MiraLax (over the counter) for constipation as needed.   CPM  As directed    Comments:     Continuous passive motion machine (CPM):      Use the CPM from 0 to 60 for 5 hours per day.      You may increase by 10 degrees per day.  You may break it up into 2 or 3 sessions per day.      Use CPM for 2 weeks or until you are told to stop.   Diet - low sodium heart healthy  As directed    Do not put a pillow under the knee. Place it under the heel.  As directed    Driving restrictions   As directed    Comments:     No driving for 2 weeks   Increase activity slowly as tolerated  As directed  Patient may shower  As directed    Comments:     You may shower without a dressing once there is no drainage.  Do not wash over the wound.  If drainage remains, cover wound with plastic wrap and then shower.      Follow-up Information   Follow up with Nestor Lewandowsky, MD In 1 week.   Specialty:  Orthopedic Surgery   Contact information:   [address] [place] [address] [phone]        Signed: Nestor Lewandowsky 06/16/2013, 5:32 PM

## 2013-06-16 NOTE — Progress Notes (Signed)
Patient ID: JESILYN EASOM, female   DOB: 05/17/1951, 62 y.o.   MRN: [id number] PATIENT ID: KAMAIYA ANTILLA  MRN: [id number]  DOB/AGE:  Oct 13, 1950 / 62 y.o.  3 Days Post-Op Procedure(s) (LRB): TOTAL KNEE ARTHROPLASTY (Left)    PROGRESS NOTE Subjective: Patient is alert, oriented, no Nausea, no Vomiting, yes passing gas, yes Bowel Movement. Taking PO well. Denies SOB, Chest or Calf Pain. Using Incentive Spirometer, PAS in place. Ambulate weight bearing as tolerated, walked 15 feet and around, still wants to go home. CPM 0-7 Patient reports pain as 3 on 0-10 scale  .    Objective: Vital signs in last 24 hours: Filed Vitals:   06/15/13 2000 06/16/13 0000 06/16/13 0400 06/16/13 0610  BP:    146/77  Pulse:    85  Temp:    98.7 F (37.1 C)  TempSrc:      Resp: 16 17 18 18   SpO2:    99%      Intake/Output from previous day: I/O last 3 completed shifts: In: 1172.8 [P.O.:200; I.V.:972.8] Out: -    Intake/Output this shift:     LABORATORY DATA:  Recent Labs  06/15/13 0535 06/16/13 0405  WBC 12.8* 12.4*  HGB 10.4* 9.7*  HCT 29.2* 27.9*  PLT 158 162    Examination: Neurologically intact ABD soft Neurovascular intact Sensation intact distally Intact pulses distally Dorsiflexion/Plantar flexion intact Incision: no drainage No cellulitis present Compartment soft}  Dressing changed by me today to 4 x 4's and Ace wrap wound clean and dry staple line intact  Assessment:   3 Days Post-Op Procedure(s) (LRB): TOTAL KNEE ARTHROPLASTY (Left) ADDITIONAL DIAGNOSIS:  Hypertension  Plan: PT/OT WBAT, CPM 5/hrs day until ROM 0-90 degrees, then D/C CPM DVT Prophylaxis:  SCDx72hrs, ASA 325 mg BID x 2 weeks DISCHARGE PLAN: Home, patient is adamant about going home. She understands she must walk 150 feet, up and down 4 steps, and passed all physical therapy goals. Having said that this is her desire and she will work hard in physical therapy today if she meets her goals she may go home  if not we will assist her in placement in a rehabilitation facility. DISCHARGE NEEDS: HHPT, HHRN, CPM, Walker and 3-in-1 comode seat     Latana Colin J 06/16/2013, 8:35 AM

## 2016-02-26 ENCOUNTER — Other Ambulatory Visit (HOSPITAL_COMMUNITY): Payer: Self-pay | Admitting: Gastroenterology

## 2016-02-26 DIAGNOSIS — B182 Chronic viral hepatitis C: Secondary | ICD-10-CM

## 2016-04-14 ENCOUNTER — Encounter (HOSPITAL_COMMUNITY): Payer: Self-pay | Admitting: Emergency Medicine

## 2016-04-14 ENCOUNTER — Emergency Department (HOSPITAL_COMMUNITY)
Admission: EM | Admit: 2016-04-14 | Discharge: 2016-04-14 | Disposition: A | Discharge status: Home / Self Care | Payer: Medicare Other | Attending: Physician Assistant | Admitting: Physician Assistant

## 2016-04-14 ENCOUNTER — Ambulatory Visit (HOSPITAL_COMMUNITY): Payer: Medicare Other

## 2016-04-14 ENCOUNTER — Emergency Department (HOSPITAL_COMMUNITY): Payer: Medicare Other

## 2016-04-14 DIAGNOSIS — Z96653 Presence of artificial knee joint, bilateral: Secondary | ICD-10-CM | POA: Insufficient documentation

## 2016-04-14 DIAGNOSIS — F17210 Nicotine dependence, cigarettes, uncomplicated: Secondary | ICD-10-CM | POA: Insufficient documentation

## 2016-04-14 DIAGNOSIS — J449 Chronic obstructive pulmonary disease, unspecified: Secondary | ICD-10-CM | POA: Insufficient documentation

## 2016-04-14 DIAGNOSIS — I129 Hypertensive chronic kidney disease with stage 1 through stage 4 chronic kidney disease, or unspecified chronic kidney disease: Secondary | ICD-10-CM | POA: Insufficient documentation

## 2016-04-14 DIAGNOSIS — K529 Noninfective gastroenteritis and colitis, unspecified: Secondary | ICD-10-CM | POA: Diagnosis not present

## 2016-04-14 DIAGNOSIS — R1013 Epigastric pain: Secondary | ICD-10-CM

## 2016-04-14 DIAGNOSIS — Z79899 Other long term (current) drug therapy: Secondary | ICD-10-CM | POA: Insufficient documentation

## 2016-04-14 DIAGNOSIS — N183 Chronic kidney disease, stage 3 (moderate): Secondary | ICD-10-CM | POA: Diagnosis not present

## 2016-04-14 DIAGNOSIS — Z7982 Long term (current) use of aspirin: Secondary | ICD-10-CM | POA: Diagnosis not present

## 2016-04-14 DIAGNOSIS — R1010 Upper abdominal pain, unspecified: Secondary | ICD-10-CM | POA: Diagnosis present

## 2016-04-14 DIAGNOSIS — R112 Nausea with vomiting, unspecified: Secondary | ICD-10-CM

## 2016-04-14 LAB — CBC WITH DIFFERENTIAL/PLATELET
BASOS ABS: 0 10*3/uL (ref 0.0–0.1)
Basophils Relative: 0 %
EOS ABS: 0.1 10*3/uL (ref 0.0–0.7)
Eosinophils Relative: 1 %
HCT: 44 % (ref 36.0–46.0)
Hemoglobin: 14.9 g/dL (ref 12.0–15.0)
Lymphocytes Relative: 16 %
Lymphs Abs: 2.1 10*3/uL (ref 0.7–4.0)
MCH: 29.4 pg (ref 26.0–34.0)
MCHC: 33.9 g/dL (ref 30.0–36.0)
MCV: 86.8 fL (ref 78.0–100.0)
MONO ABS: 1 10*3/uL (ref 0.1–1.0)
Monocytes Relative: 8 %
Neutro Abs: 9.5 10*3/uL — ABNORMAL HIGH (ref 1.7–7.7)
Neutrophils Relative %: 75 %
Platelets: 219 10*3/uL (ref 150–400)
RBC: 5.07 MIL/uL (ref 3.87–5.11)
RDW: 15.2 % (ref 11.5–15.5)
WBC: 12.7 10*3/uL — AB (ref 4.0–10.5)

## 2016-04-14 LAB — URINALYSIS, ROUTINE W REFLEX MICROSCOPIC
Bilirubin Urine: NEGATIVE
Glucose, UA: NEGATIVE mg/dL
HGB URINE DIPSTICK: NEGATIVE
Ketones, ur: 15 mg/dL — AB
Nitrite: POSITIVE — AB
Protein, ur: 30 mg/dL — AB
SPECIFIC GRAVITY, URINE: 1.023 (ref 1.005–1.030)
pH: 5.5 (ref 5.0–8.0)

## 2016-04-14 LAB — COMPREHENSIVE METABOLIC PANEL
ALT: 45 U/L (ref 14–54)
AST: 46 U/L — AB (ref 15–41)
Albumin: 3.7 g/dL (ref 3.5–5.0)
Alkaline Phosphatase: 55 U/L (ref 38–126)
Anion gap: 8 (ref 5–15)
BUN: 26 mg/dL — AB (ref 6–20)
CHLORIDE: 108 mmol/L (ref 101–111)
CO2: 21 mmol/L — AB (ref 22–32)
CREATININE: 1.81 mg/dL — AB (ref 0.44–1.00)
Calcium: 9.2 mg/dL (ref 8.9–10.3)
GFR, EST AFRICAN AMERICAN: 33 mL/min — AB (ref >60)
GFR, EST NON AFRICAN AMERICAN: 28 mL/min — AB (ref >60)
Glucose, Bld: 129 mg/dL — ABNORMAL HIGH (ref 65–99)
Potassium: 4.5 mmol/L (ref 3.5–5.1)
SODIUM: 137 mmol/L (ref 135–145)
Total Bilirubin: 0.5 mg/dL (ref 0.3–1.2)
Total Protein: 7.7 g/dL (ref 6.5–8.1)

## 2016-04-14 LAB — URINE MICROSCOPIC-ADD ON: RBC / HPF: NONE SEEN RBC/hpf (ref 0–5)

## 2016-04-14 LAB — LIPASE, BLOOD: Lipase: 31 U/L (ref 11–51)

## 2016-04-14 MED ORDER — DICYCLOMINE HCL 10 MG PO CAPS: 10.0000 mg | ORAL_CAPSULE | Freq: Three times a day (TID) | ORAL | 0 refills | Status: AC | PRN

## 2016-04-14 MED ORDER — ONDANSETRON 4 MG PO TBDP: 4.0000 mg | ORAL_TABLET | Freq: Three times a day (TID) | ORAL | 0 refills | Status: AC | PRN

## 2016-04-14 MED ORDER — SODIUM CHLORIDE 0.9 % IV BOLUS (SEPSIS)
1000.0000 mL | Freq: Once | INTRAVENOUS | Status: AC
  Administered 2016-04-14: 1000 mL via INTRAVENOUS

## 2016-04-14 MED ORDER — DICYCLOMINE HCL 10 MG PO CAPS
10.0000 mg | ORAL_CAPSULE | Freq: Once | ORAL | Status: AC
  Administered 2016-04-14: 10 mg via ORAL
  Filled 2016-04-14: qty 1

## 2016-04-14 MED ORDER — MORPHINE SULFATE (PF) 4 MG/ML IV SOLN
4.0000 mg | Freq: Once | INTRAVENOUS | Status: AC
  Administered 2016-04-14: 4 mg via INTRAVENOUS
  Filled 2016-04-14: qty 1

## 2016-04-14 MED ORDER — CEPHALEXIN 500 MG PO CAPS: 500.0000 mg | ORAL_CAPSULE | Freq: Four times a day (QID) | ORAL | 0 refills | Status: AC

## 2016-04-14 MED ORDER — ONDANSETRON HCL 4 MG/2ML IJ SOLN
4.0000 mg | Freq: Once | INTRAMUSCULAR | Status: AC
  Administered 2016-04-14: 4 mg via INTRAVENOUS
  Filled 2016-04-14: qty 2

## 2016-04-14 NOTE — ED Notes (Signed)
PA Nicole at the bedside  

## 2016-04-14 NOTE — ED Notes (Signed)
Pt returned from CT °

## 2016-04-14 NOTE — Discharge Instructions (Signed)
Take your medications as prescribed. Continue drinking fluids at home to remain hydrated. I recommend eating a bland diet for the next few days and today her symptoms have improved. Follow-up with your primary care provider within the next week. I recommend having her blood rechecked due to a mild increase in your creatinine (Cr 1.8) found in your blood work done in the ED today. Please return to the Emergency Department if symptoms worsen or new onset of fever, abdominal pain, vomiting, unable to keep fluids down, vomiting blood, blood in stool chest pain, difficulty breathing.

## 2016-04-14 NOTE — ED Provider Notes (Signed)
[facility]-EMERGENCY DEPT Provider Note   CSN: [id number] Arrival date & time: 04/14/16  0846     History   Chief Complaint Chief Complaint  Patient presents with  . Abdominal Pain  . Emesis    HPI Terri York is a 65 y.o. female.  Patient 65 year old female with past medical history of hypertension, hyperlipidemia, COPD, GERD, CKD and recent diagnosis of hepatitis C who presents the ED with complaint of upper abdominal pain, onset yesterday afternoon. Patient reports yesterday while she was out running errands she began to have gradual onset of intermittent waxing and waning cramping to her upper abdominal pain. Patient reports throughout the day the cramping continued and notes her pain worsen this morning when she woke up. Endorses associated nausea and reports having one episode of NBNB vomiting and 4 episodes of nonbloody diarrhea this morning. Denies fever, chills, chest pain, shortness of breath, hematemesis, urinary symptoms, vaginal bleeding, vaginal discharge, blood in urine or stool. Patient denies taking any medications at home for her symptoms. She notes she was scheduled to have an abdominal ultrasound performed this morning for further evaluation of her recent diagnosis of hepatitis C but notes due to her worsening abdominal pain she came to the ED and missed her appointment. Abdominal surgical history includes tubal ligation.      Past Medical History:  Diagnosis Date  . Asthma   . Chronic kidney disease (CKD), stage III (moderate)    stage III (according to 04/2013 Dr. Georgena SpurlingGassemi notes)  . COPD (chronic obstructive pulmonary disease) (HCC)   . Exertional shortness of breath   . GERD (gastroesophageal reflux disease)   . High cholesterol   . Hypertension   . Orthopnea   . Osteoarthritis   . Tuberculosis 1996   "took RX for 6 months and that was the end of that" (06/14/2013)    Patient Active Problem List   Diagnosis Date Noted  . Arthritis of knee, left 06/12/2013      Past Surgical History:  Procedure Laterality Date  . COLONOSCOPY     Hx: of  . TONSILLECTOMY    . TOTAL KNEE ARTHROPLASTY Left 06/13/2013  . TOTAL KNEE ARTHROPLASTY Left 06/13/2013   Procedure: TOTAL KNEE ARTHROPLASTY;  Surgeon: Nestor LewandowskyFrank J Rowan, MD;  Location: [facility] OR;  Service: Orthopedics;  Laterality: Left;  . TUBAL LIGATION      OB History    No data available       Home Medications    Prior to Admission medications   Medication Sig Start Date End Date Taking? Authorizing Provider  amLODipine (NORVASC) 10 MG tablet Take 10 mg by mouth daily.    Historical Provider, MD  aspirin EC 325 MG tablet Take 1 tablet (325 mg total) by mouth 2 (two) times daily. 06/13/13   Allena KatzEric K Phillips, PA-C  atenolol (TENORMIN) 25 MG tablet Take 25 mg by mouth daily.    Historical Provider, MD  budesonide-formoterol (SYMBICORT) 160-4.5 MCG/ACT inhaler Inhale 2 puffs into the lungs 2 (two) times daily.    Historical Provider, MD  cephALEXin (KEFLEX) 500 MG capsule Take 1 capsule (500 mg total) by mouth 4 (four) times daily. 04/14/16   Barrett HenleNicole Elizabeth Oaklie Durrett, PA-C  dicyclomine (BENTYL) 10 MG capsule Take 1 capsule (10 mg total) by mouth 3 (three) times daily as needed for spasms. 04/14/16   Barrett HenleNicole Elizabeth Elsy Chiang, PA-C  lisinopril (PRINIVIL,ZESTRIL) 40 MG tablet Take 40 mg by mouth daily.    Historical Provider, MD  methocarbamol (ROBAXIN) 500 MG  tablet Take 1 tablet (500 mg total) by mouth 2 (two) times daily with a meal. 06/13/13   Allena Katz, PA-C  Multiple Vitamin (MULTIVITAMIN WITH MINERALS) TABS tablet Take 1 tablet by mouth daily.    Historical Provider, MD  omeprazole (PRILOSEC) 40 MG capsule Take 40 mg by mouth daily.    Historical Provider, MD  ondansetron (ZOFRAN ODT) 4 MG disintegrating tablet Take 1 tablet (4 mg total) by mouth every 8 (eight) hours as needed for nausea or vomiting. 04/14/16   Barrett Henle, PA-C  oxyCODONE-acetaminophen (ROXICET) 5-325 MG per tablet Take 1 tablet by  mouth every 4 (four) hours as needed. 06/13/13   Allena Katz, PA-C  pravastatin (PRAVACHOL) 40 MG tablet Take 40 mg by mouth daily.    Historical Provider, MD    Family History Family History  Problem Relation Age of Onset  . Hypertension Mother   . Arthritis/Rheumatoid Mother   . Diabetes Father   . Heart failure Father   . Arthritis/Rheumatoid Brother   . Diabetes Other     Social History Social History  Substance Use Topics  . Smoking status: Current Some Day Smoker    Packs/day: 0.12    Years: 37.00    Types: Cigarettes  . Smokeless tobacco: Never Used  . Alcohol use Yes     Comment: 06/14/2013 "drink at weddings, birthdays, occasionally"     Allergies   Review of patient's allergies indicates no known allergies.   Review of Systems Review of Systems  Gastrointestinal: Positive for abdominal pain, diarrhea, nausea and vomiting.  All other systems reviewed and are negative.    Physical Exam Updated Vital Signs BP 157/85 (BP Location: Right Arm)   Pulse 68   Temp 98 F (36.7 C) (Oral)   Resp 20   Ht 5\' 4"  (1.626 m)   Wt 90.3 kg   SpO2 100%   BMI 34.16 kg/m   Physical Exam  Constitutional: She is oriented to person, place, and time. She appears well-developed and well-nourished.  Pt appears to be in mild discomfort and is holding onto her abdomen intermittently throughout exam.  HENT:  Head: Normocephalic and atraumatic.  Mouth/Throat: Oropharynx is clear and moist. No oropharyngeal exudate.  Eyes: Conjunctivae and EOM are normal. Right eye exhibits no discharge. Left eye exhibits no discharge. No scleral icterus.  Neck: Normal range of motion. Neck supple.  Cardiovascular: Normal rate, regular rhythm, normal heart sounds and intact distal pulses.   Pulmonary/Chest: Effort normal and breath sounds normal. No respiratory distress. She has no wheezes. She has no rales. She exhibits no tenderness.  Abdominal: Soft. Bowel sounds are normal. She exhibits no  distension and no mass. There is tenderness. There is no rebound and no guarding. No hernia.  No CVA tenderness. TTP over upper abdominal quadrants.  Musculoskeletal: Normal range of motion. She exhibits no edema.  Neurological: She is alert and oriented to person, place, and time.  Skin: Skin is warm and dry.  Nursing note and vitals reviewed.    ED Treatments / Results  Labs (all labs ordered are listed, but only abnormal results are displayed) Labs Reviewed  CBC WITH DIFFERENTIAL/PLATELET - Abnormal; Notable for the following:       Result Value   WBC 12.7 (*)    Neutro Abs 9.5 (*)    All other components within normal limits  COMPREHENSIVE METABOLIC PANEL - Abnormal; Notable for the following:    CO2 21 (*)    Glucose,  Bld 129 (*)    BUN 26 (*)    Creatinine, Ser 1.81 (*)    AST 46 (*)    GFR calc non Af Amer 28 (*)    GFR calc Af Amer 33 (*)    All other components within normal limits  URINALYSIS, ROUTINE W REFLEX MICROSCOPIC (NOT AT [facility]) - Abnormal; Notable for the following:    APPearance CLOUDY (*)    Ketones, ur 15 (*)    Protein, ur 30 (*)    Nitrite POSITIVE (*)    Leukocytes, UA SMALL (*)    All other components within normal limits  URINE MICROSCOPIC-ADD ON - Abnormal; Notable for the following:    Squamous Epithelial / LPF 0-5 (*)    Bacteria, UA MANY (*)    Casts HYALINE CASTS (*)    All other components within normal limits  URINE CULTURE  LIPASE, BLOOD    EKG  EKG Interpretation None       Radiology Ct Abdomen Pelvis Wo Contrast  Result Date: 04/14/2016 CLINICAL DATA:  Sharp mid abdominal pain. Nausea, vomiting, diarrhea. EXAM: CT ABDOMEN AND PELVIS WITHOUT CONTRAST TECHNIQUE: Multidetector CT imaging of the abdomen and pelvis was performed following the standard protocol without IV contrast. COMPARISON:  None. FINDINGS: Lower chest:  No acute findings. Hepatobiliary: No mass visualized on this un-enhanced exam. Pancreas: No mass or inflammatory  process identified on this un-enhanced exam. Spleen: Within normal limits in size. Adrenals/Urinary Tract: Normal adrenal glands. No urolithiasis or obstructive uropathy. 2.5 cm hypodense, fluid attenuating right upper pole renal mass most consistent with a cyst. Normal bladder. Stomach/Bowel: No bowel dilatation. No focal fluid collection. Mild relative small bowel wall thickening and surrounding inflammatory changes in the right lower quadrant concerning for enteritis secondary to an infectious or inflammatory etiology. No pneumatosis, pneumoperitoneum or portal venous gas. Normal appendix. Vascular/Lymphatic: No pathologically enlarged lymph nodes. No evidence of abdominal aortic aneurysm. Abdominal aortic atherosclerosis. Reproductive: No mass or other significant abnormality. Other: Small amount of pelvic free fluid. Musculoskeletal: No suspicious bone lesions identified. Degenerative disc disease with disc height loss at L4-5 with bilateral facet arthropathy. Severe bilateral facet arthropathy at L3-4. Severe disc height loss at L5-S1 with endplate irregularity and sclerosis likely secondary to advanced degenerative disc disease versus less likely discitis. IMPRESSION: 1. Mild relative small bowel wall thickening and surrounding inflammatory changes in the right lower quadrant concerning for enteritis secondary to an infectious or inflammatory etiology. 2. Severe disc height loss at L5-S1 with endplate irregularity and sclerosis likely secondary to advanced degenerative disc disease versus less likely discitis. Electronically Signed   By: Elige Ko   On: 04/14/2016 10:55    Procedures Procedures (including critical care time)  Medications Ordered in ED Medications  sodium chloride 0.9 % bolus 1,000 mL (0 mLs Intravenous Stopped 04/14/16 1054)  ondansetron (ZOFRAN) injection 4 mg (4 mg Intravenous Given 04/14/16 0940)  morphine 4 MG/ML injection 4 mg (4 mg Intravenous Given 04/14/16 0941)  dicyclomine  (BENTYL) capsule 10 mg (10 mg Oral Given 04/14/16 1148)     Initial Impression / Assessment and Plan / ED Course  I have reviewed the triage vital signs and the nursing notes.  Pertinent labs & imaging results that were available during my care of the patient were reviewed by me and considered in my medical decision making (see chart for details).  Clinical Course    Patient presents with upper abdominal pain with associated nausea, vomiting and diarrhea. History of  hepatitis C. Patient reports she was planning on going to have an ultrasound done for further evaluation but notes because of new onset abdominal pain she came to the ED and missed her appointment. VSS. Exam revealed tenderness over upper abdominal quadrants, no peritoneal signs, remaining exam unremarkable. Patient given IV fluids, pain meds and Zofran.  WBC 12.7. UA consistent with UTI, urine culture sent. BUN mildly elevated at 26 and creatinine mildly elevated at 1.81 (baseline Cr 1.36); suspect is likely due to mild dehydration. Remaining labs unremarkable. Will order CT abdomen for further evaluation. On reevaluation patient reports her pain and nausea have improved. CT showed evidence of enteritis. Patient able to tolerate PO. Discussed results and plan for discharge with patient. Plan to discharge patient home with symptomatic treatment and antibiotics for UTI. Advised patient to follow up with her PCP within the next week. Discussed return precautions with patient.  Patients blood pressure 150/105 on initial evaluation. Patient reports history of hypertension and denies taking her medications this morning, endorses taking her dose yesterday. Patient denies headache, visual changes, lightheadedness, dizziness, shortness of breath, chest pain, numbness, tingling, weakness. No signs of hypertensive emergency or urgency at this time. Repeat BP 144/78. Advised patient to take her medications after being discharged from the ED today.  Advised patient to follow up with her PCP in one week to have her blood pressure rechecked.   Final Clinical Impressions(s) / ED Diagnoses   Final diagnoses:  Enteritis    New Prescriptions Discharge Medication List as of 04/14/2016  1:23 PM    START taking these medications   Details  cephALEXin (KEFLEX) 500 MG capsule Take 1 capsule (500 mg total) by mouth 4 (four) times daily., Starting Wed 04/14/2016, Print    dicyclomine (BENTYL) 10 MG capsule Take 1 capsule (10 mg total) by mouth 3 (three) times daily as needed for spasms., Starting Wed 04/14/2016, Print    ondansetron (ZOFRAN ODT) 4 MG disintegrating tablet Take 1 tablet (4 mg total) by mouth every 8 (eight) hours as needed for nausea or vomiting., Starting Wed 04/14/2016, Print         Satira Sark [place], [place] 04/14/16 1606    Courteney Randall An, MD 04/16/16 1011

## 2016-04-14 NOTE — ED Triage Notes (Signed)
Pt sts mid to upper left abd pain with chills and N/V

## 2016-04-14 NOTE — ED Notes (Signed)
Pt given [place] and drinking at this time

## 2016-04-14 NOTE — ED Notes (Signed)
Pt went to CT

## 2016-04-16 LAB — URINE CULTURE

## 2016-04-17 ENCOUNTER — Telehealth (HOSPITAL_BASED_OUTPATIENT_CLINIC_OR_DEPARTMENT_OTHER): Payer: Self-pay

## 2016-04-17 NOTE — Telephone Encounter (Signed)
Post ED Visit - Positive Culture Follow-up  Culture report reviewed by antimicrobial stewardship pharmacist:  []  Enzo BiNathan Batchelder, Pharm.D. []  Celedonio MiyamotoJeremy Frens, Pharm.D., BCPS [x]  Garvin FilaMike Maccia, Pharm.D. []  Georgina PillionElizabeth Martin, Pharm.D., BCPS []  LakesideMinh Pham, [address]Ph[address]D., BCPS, AAHIVP []  Estella HuskMichelle Turner, Pharm.D., BCPS, AAHIVP []  Tennis Mustassie Stewart, [address]Pharm[address] []  Sherle Poeob Vincent, [address]  Positive urine culture Treated with Cephalexin, organism sensitive to the same and no further patient follow-up is required at this time.  Terri York, Terri York 04/17/2016, 9:05 AM

## 2016-05-27 ENCOUNTER — Ambulatory Visit (HOSPITAL_COMMUNITY)
Admission: RE | Admit: 2016-05-27 | Discharge: 2016-05-27 | Disposition: A | Discharge status: Home / Self Care | Payer: Medicare Other | Source: Ambulatory Visit | Attending: Gastroenterology | Admitting: Gastroenterology

## 2016-05-27 DIAGNOSIS — B182 Chronic viral hepatitis C: Secondary | ICD-10-CM

## 2017-03-08 IMAGING — CT CT ABD-PELV W/O CM
2 of 4 series · 16 of 46 positions shown, 18 images · non-contrast
Comparison: None.

CLINICAL DATA: Sharp mid abdominal pain. Nausea, vomiting,
diarrhea.

EXAM:
CT ABDOMEN AND PELVIS WITHOUT CONTRAST
TECHNIQUE: Multidetector CT imaging of the abdomen and pelvis was performed
following the standard protocol without IV contrast.

[Series 2: a/p w/o 5mm · axial · non-contrast · 0.77mm/px · z∈[-384,+6]mm · 13 of 86 slices shown, 15 images]
[im 4/86  soft-tissue]
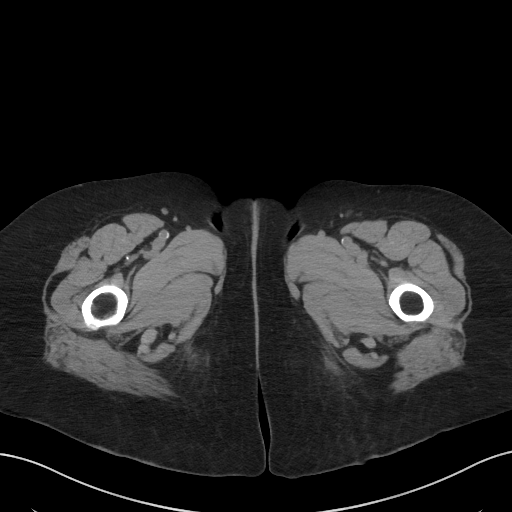
[im 4/86  bone]
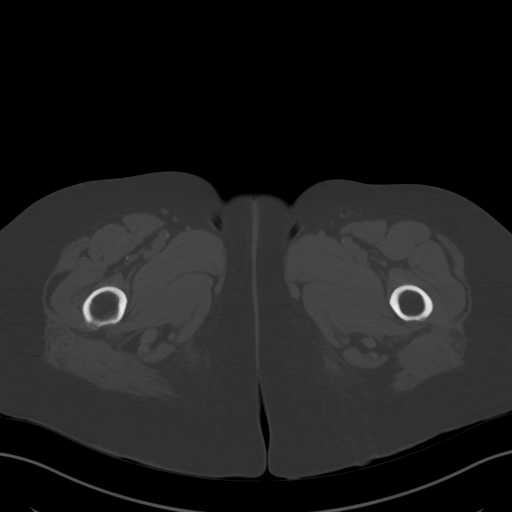
[im 12/86  soft-tissue]
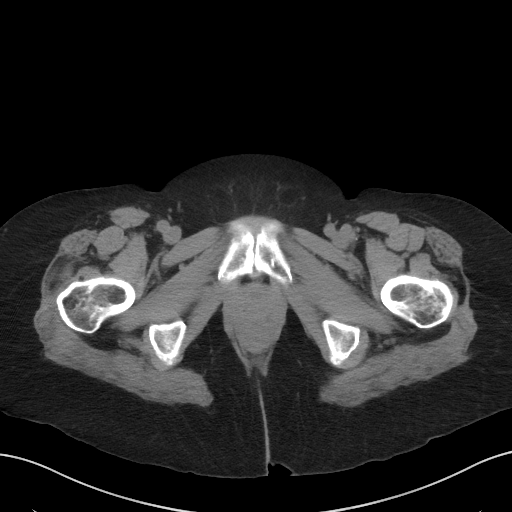
[im 19/86  soft-tissue]
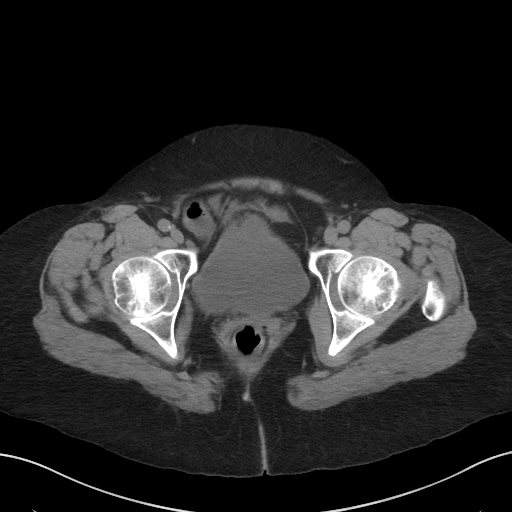
[im 23/86  soft-tissue]
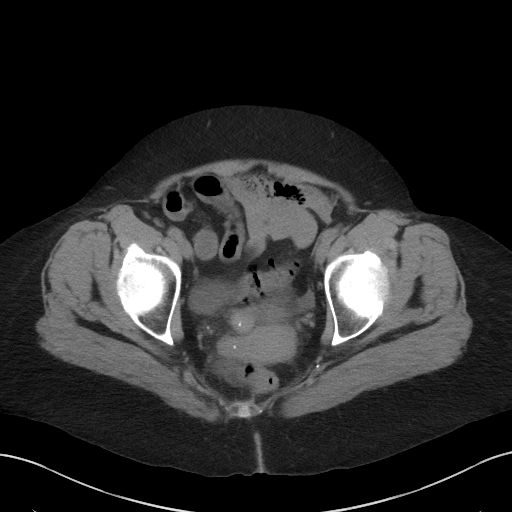
[im 30/86  soft-tissue]
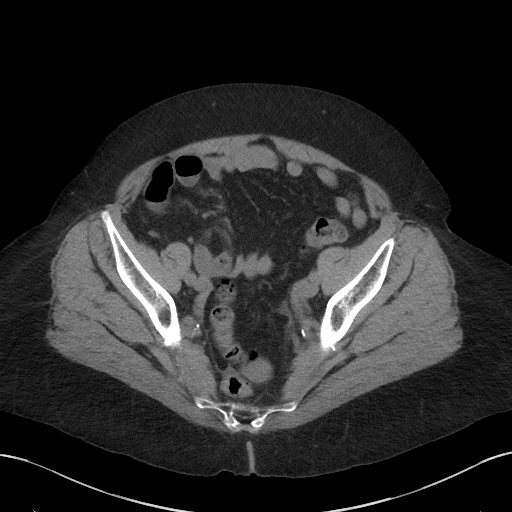
[im 37/86  soft-tissue]
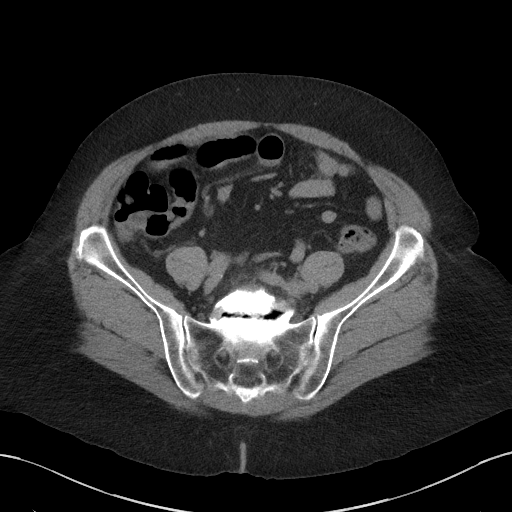
[im 45/86  soft-tissue]
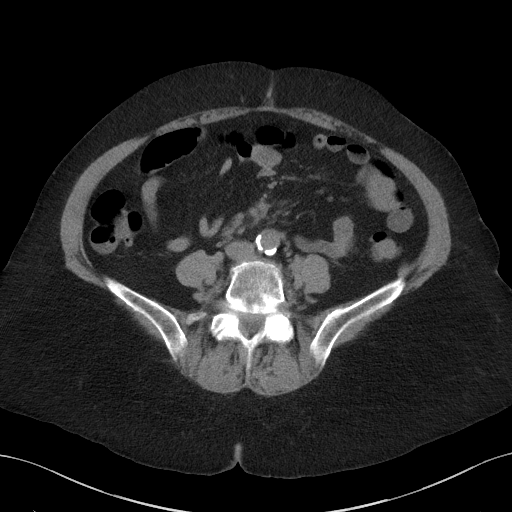
[im 49/86  soft-tissue]
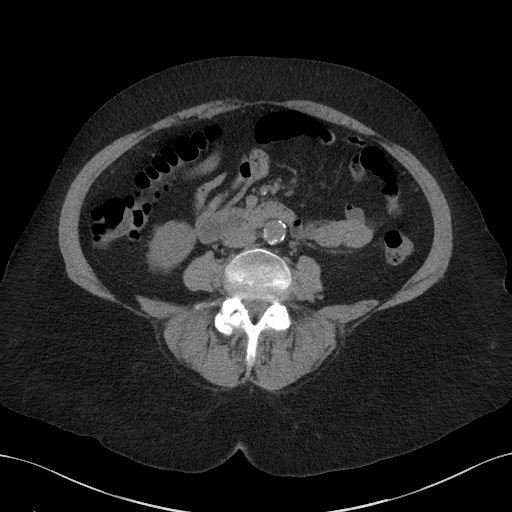
[im 56/86  soft-tissue]
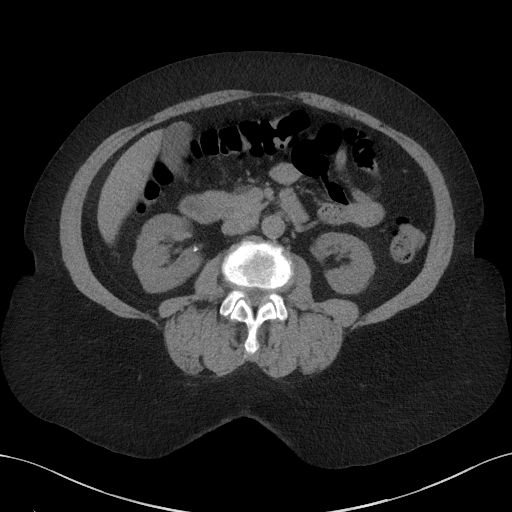
[im 56/86  bone]
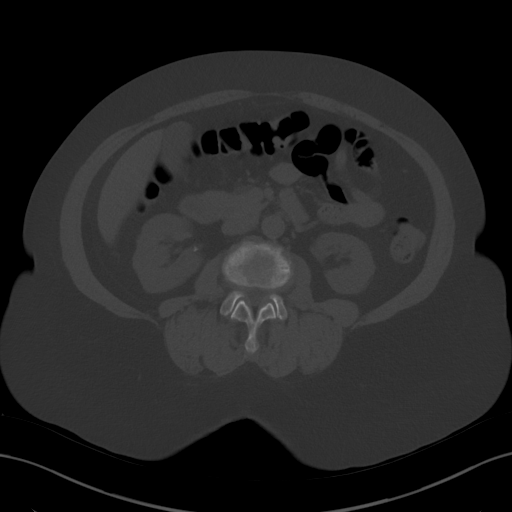
[im 63/86  soft-tissue]
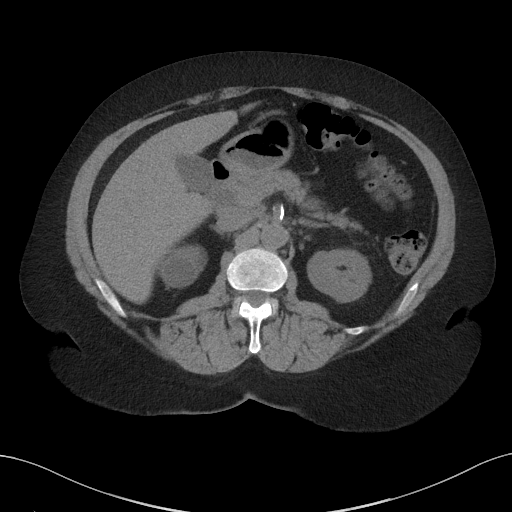
[im 67/86  soft-tissue]
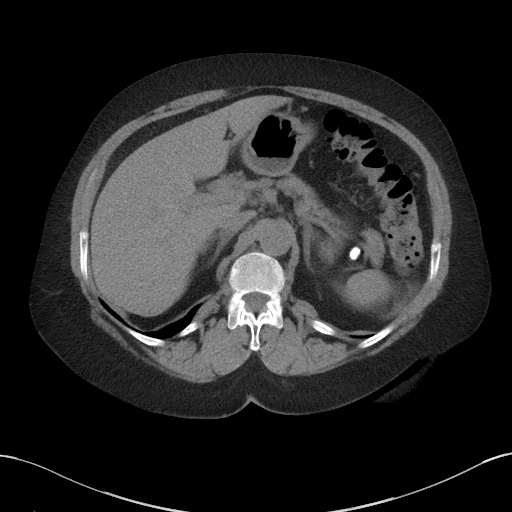
[im 74/86  soft-tissue]
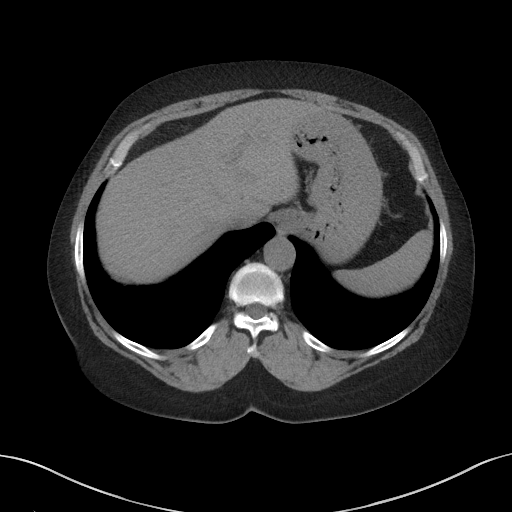
[im 82/86  soft-tissue]
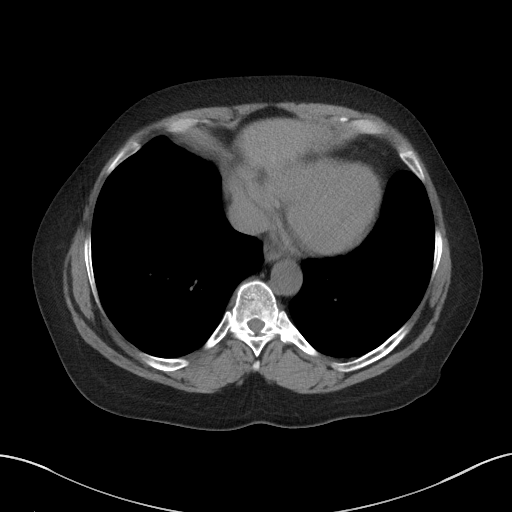

[Series 5: a/p w/o cor · coronal · non-contrast · 0.89mm/px · 3 of 154 slices shown]
[im 52/154  soft-tissue]
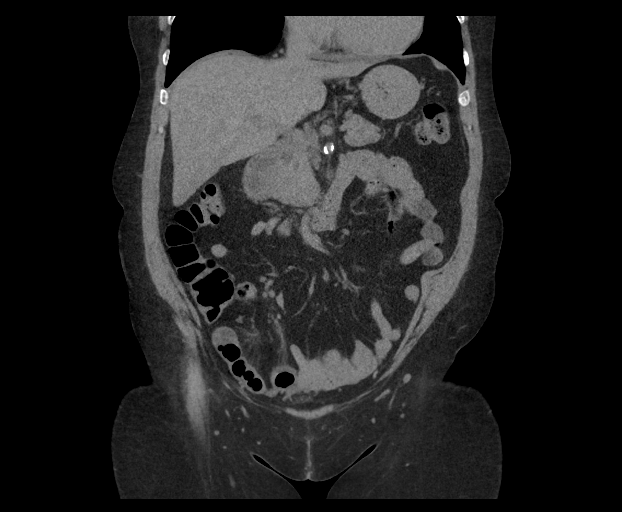
[im 69/154  soft-tissue]
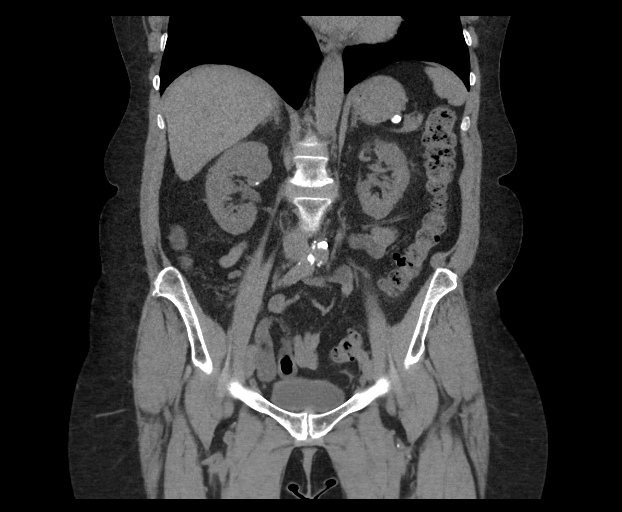
[im 86/154  soft-tissue]
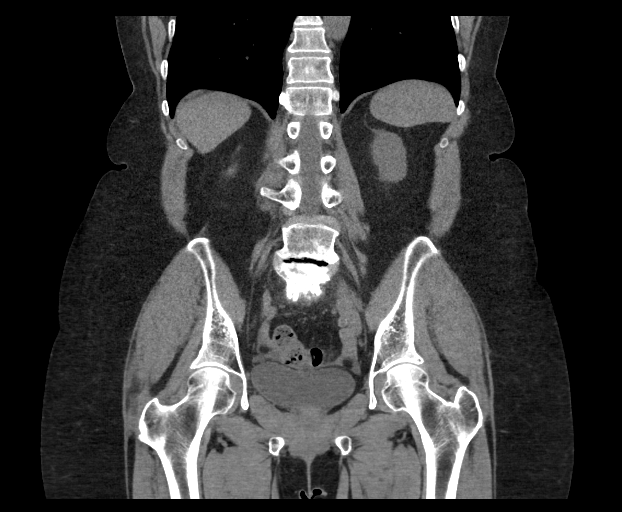

[16 of 46 positions shown; findings below may reference images not displayed]

FINDINGS: Lower chest:  No acute findings.

Hepatobiliary: No mass visualized on this un-enhanced exam.

Pancreas: No mass or inflammatory process identified on this
un-enhanced exam.

Spleen: Within normal limits in size.

Adrenals/Urinary Tract: Normal adrenal glands. No urolithiasis or
obstructive uropathy. 2.5 cm hypodense, fluid attenuating right
upper pole renal mass most consistent with a cyst. Normal bladder.

Stomach/Bowel: No bowel dilatation. No focal fluid collection. Mild
relative small bowel wall thickening and surrounding inflammatory
changes in the right lower quadrant concerning for enteritis
secondary to an infectious or inflammatory etiology. No pneumatosis,
pneumoperitoneum or portal venous gas. Normal appendix.

Vascular/Lymphatic: No pathologically enlarged lymph nodes. No
evidence of abdominal aortic aneurysm. Abdominal aortic
atherosclerosis.

Reproductive: No mass or other significant abnormality.

Other: Small amount of pelvic free fluid.

Musculoskeletal: No suspicious bone lesions identified. Degenerative
disc disease with disc height loss at L4-5 with bilateral facet
arthropathy. Severe bilateral facet arthropathy at L3-4. Severe disc
height loss at L5-S1 with endplate irregularity and sclerosis likely
secondary to advanced degenerative disc disease versus less likely
discitis.
IMPRESSION: 1. Mild relative small bowel wall thickening and surrounding
inflammatory changes in the right lower quadrant concerning for
enteritis secondary to an infectious or inflammatory etiology.
2. Severe disc height loss at L5-S1 with endplate irregularity and
sclerosis likely secondary to advanced degenerative disc disease
versus less likely discitis.
